# Patient Record
Sex: Female | Born: 1970 | Race: Black or African American | Hispanic: No | Marital: Married | Smoking: Never smoker
Health system: Southern US, Community
[De-identification: ages and names within clinical notes are randomized; demographics above are authoritative.]

## PROBLEM LIST (undated history)

## (undated) DIAGNOSIS — F329 Major depressive disorder, single episode, unspecified: Secondary | ICD-10-CM

## (undated) DIAGNOSIS — K5792 Diverticulitis of intestine, part unspecified, without perforation or abscess without bleeding: Secondary | ICD-10-CM

## (undated) DIAGNOSIS — R51 Headache: Secondary | ICD-10-CM

## (undated) DIAGNOSIS — K589 Irritable bowel syndrome without diarrhea: Secondary | ICD-10-CM

## (undated) DIAGNOSIS — E079 Disorder of thyroid, unspecified: Secondary | ICD-10-CM

## (undated) DIAGNOSIS — T7840XA Allergy, unspecified, initial encounter: Secondary | ICD-10-CM

## (undated) DIAGNOSIS — E119 Type 2 diabetes mellitus without complications: Secondary | ICD-10-CM

## (undated) DIAGNOSIS — K219 Gastro-esophageal reflux disease without esophagitis: Secondary | ICD-10-CM

## (undated) DIAGNOSIS — F32A Depression, unspecified: Secondary | ICD-10-CM

## (undated) DIAGNOSIS — R519 Headache, unspecified: Secondary | ICD-10-CM

## (undated) HISTORY — DX: Depression, unspecified: F32.A

## (undated) HISTORY — DX: Major depressive disorder, single episode, unspecified: F32.9

## (undated) HISTORY — DX: Disorder of thyroid, unspecified: E07.9

## (undated) HISTORY — PX: LAPAROSCOPIC GASTRIC SLEEVE RESECTION: SHX5895

## (undated) HISTORY — DX: Headache: R51

## (undated) HISTORY — DX: Type 2 diabetes mellitus without complications: E11.9

## (undated) HISTORY — PX: SHOULDER ARTHROSCOPY W/ ROTATOR CUFF REPAIR: SHX2400

## (undated) HISTORY — DX: Allergy, unspecified, initial encounter: T78.40XA

## (undated) HISTORY — DX: Diverticulitis of intestine, part unspecified, without perforation or abscess without bleeding: K57.92

## (undated) HISTORY — DX: Headache, unspecified: R51.9

---

## 2002-02-03 HISTORY — PX: ABDOMINAL HYSTERECTOMY: SHX81

## 2003-10-04 ENCOUNTER — Ambulatory Visit (HOSPITAL_COMMUNITY): Admission: RE | Admit: 2003-10-04 | Discharge: 2003-10-04 | Payer: Self-pay | Admitting: Gynecology

## 2003-11-03 ENCOUNTER — Ambulatory Visit (HOSPITAL_COMMUNITY): Admission: RE | Admit: 2003-11-03 | Discharge: 2003-11-03 | Payer: Self-pay | Admitting: Gynecology

## 2003-11-13 ENCOUNTER — Inpatient Hospital Stay (HOSPITAL_COMMUNITY): Admission: AD | Admit: 2003-11-13 | Discharge: 2003-11-16 | Payer: Self-pay | Admitting: Gynecology

## 2004-02-04 HISTORY — PX: APPENDECTOMY: SHX54

## 2005-02-03 HISTORY — PX: BREAST REDUCTION SURGERY: SHX8

## 2005-11-05 ENCOUNTER — Ambulatory Visit: Payer: Self-pay

## 2005-11-12 ENCOUNTER — Ambulatory Visit: Payer: Self-pay

## 2005-11-24 ENCOUNTER — Ambulatory Visit: Payer: Self-pay | Admitting: Gynecology

## 2005-12-17 ENCOUNTER — Ambulatory Visit: Payer: Self-pay | Admitting: Gastroenterology

## 2006-12-01 ENCOUNTER — Ambulatory Visit: Payer: Self-pay

## 2007-03-22 ENCOUNTER — Ambulatory Visit: Payer: Self-pay | Admitting: Obstetrics and Gynecology

## 2007-03-22 ENCOUNTER — Other Ambulatory Visit: Payer: Self-pay

## 2007-08-25 IMAGING — US US PELV - US TRANSVAGINAL
1 series · 17 of 25 positions shown · non-contrast
Comparison: none

REASON FOR EXAM: LT pelvic cyst seen on CT [DATE]
COMMENTS:

[Series 1: us pelv - us transvaginal · 17 of 27 slices shown]
[im 1/27]
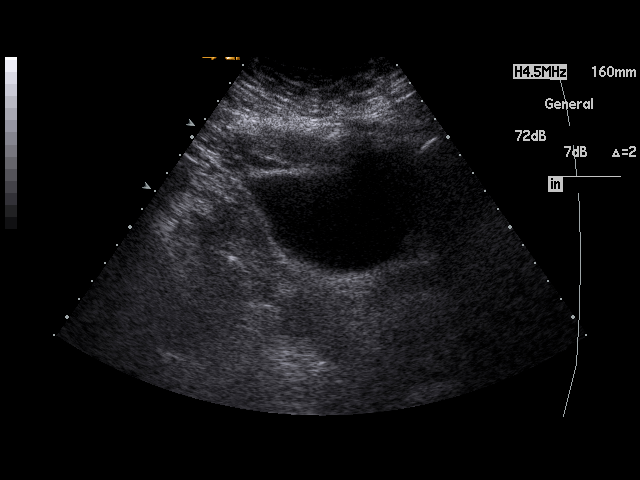
[im 3/27]
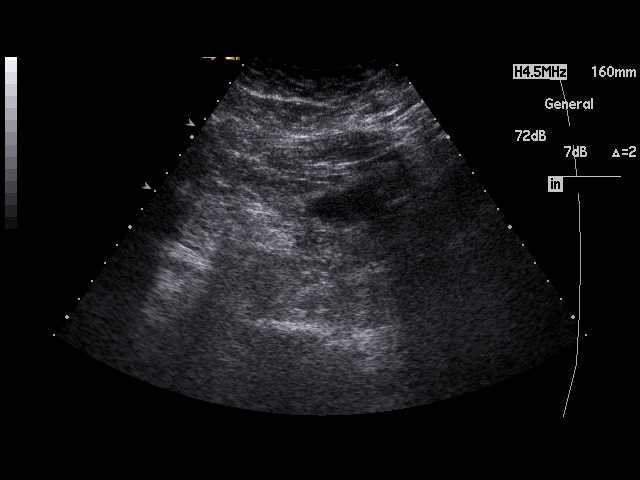
[im 4/27]
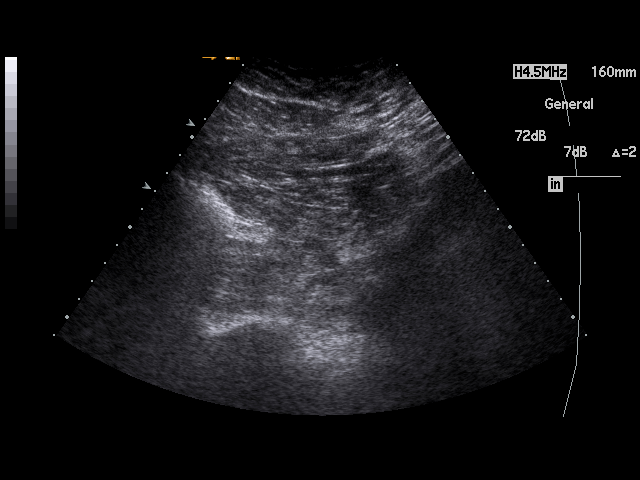
[im 6/27]
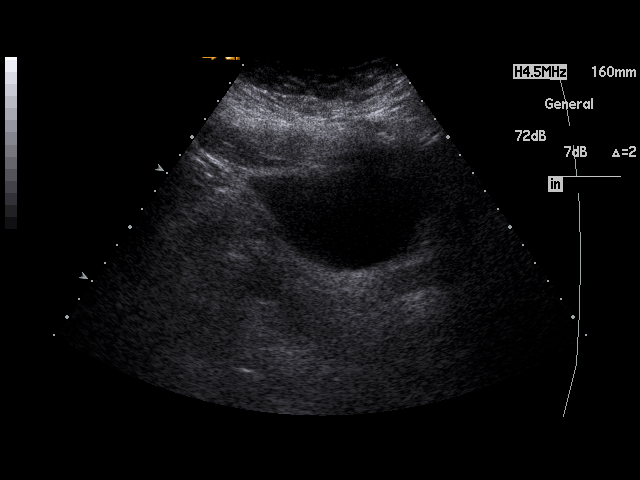
[im 7/27]
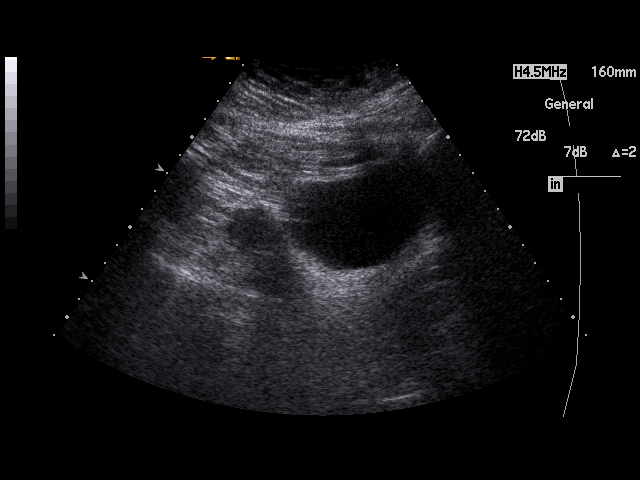
[im 9/27]
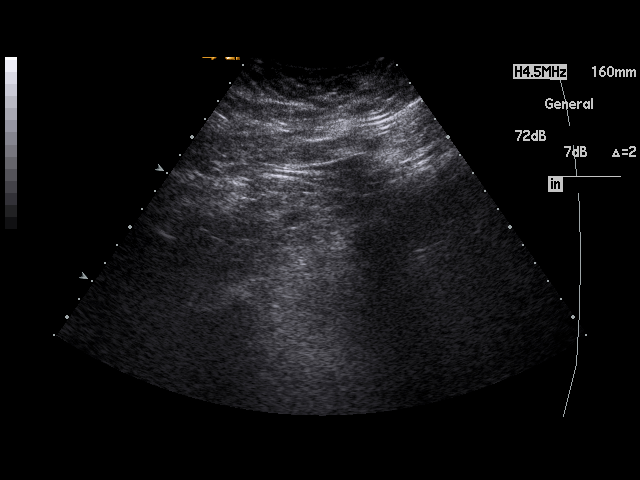
[im 10/27]
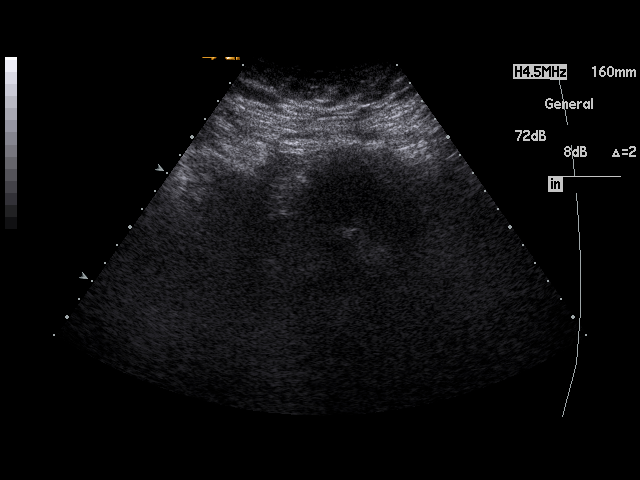
[im 12/27]
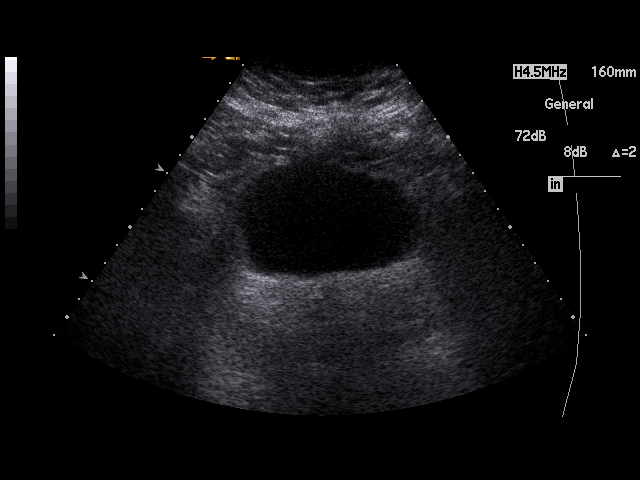
[im 14/27]
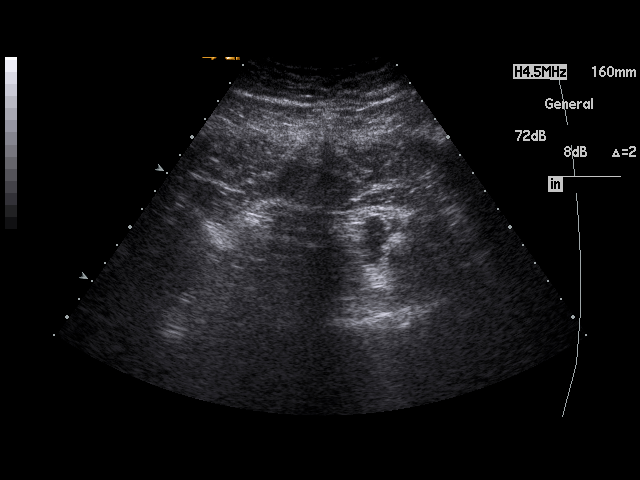
[im 15/27]
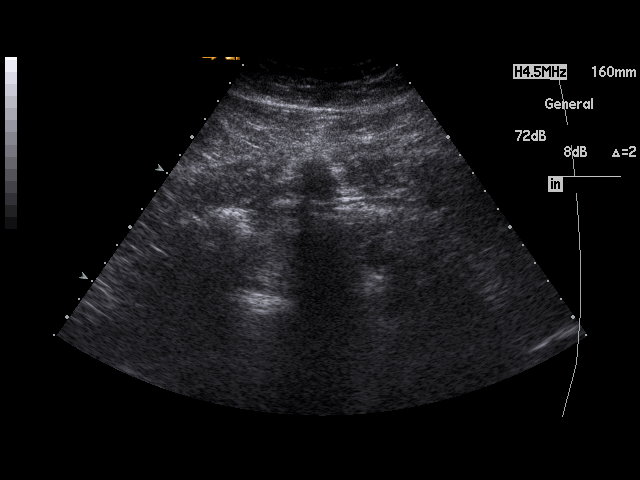
[im 17/27]
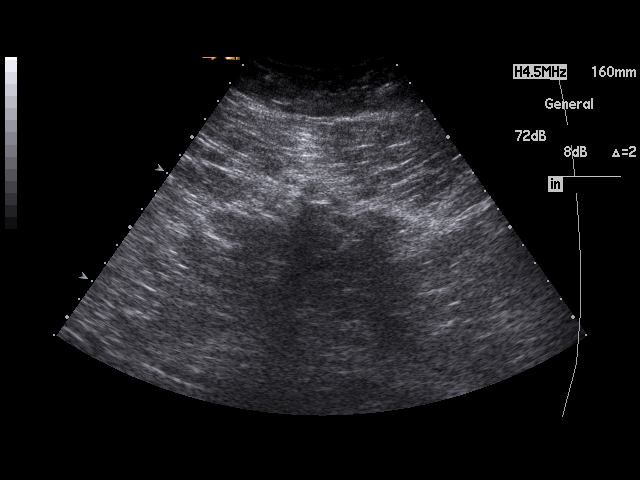
[im 18/27]
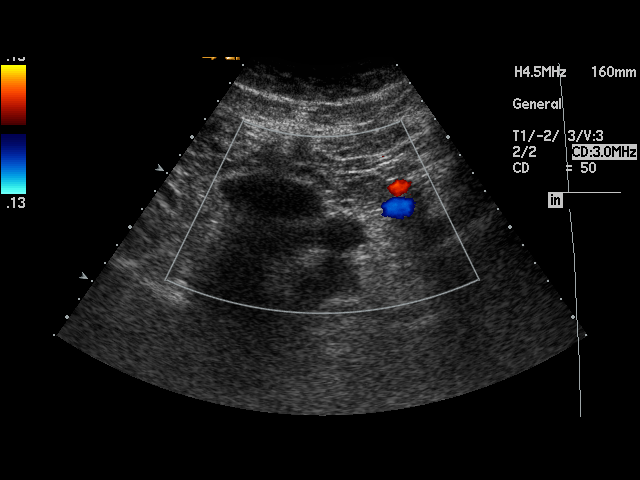
[im 20/27]
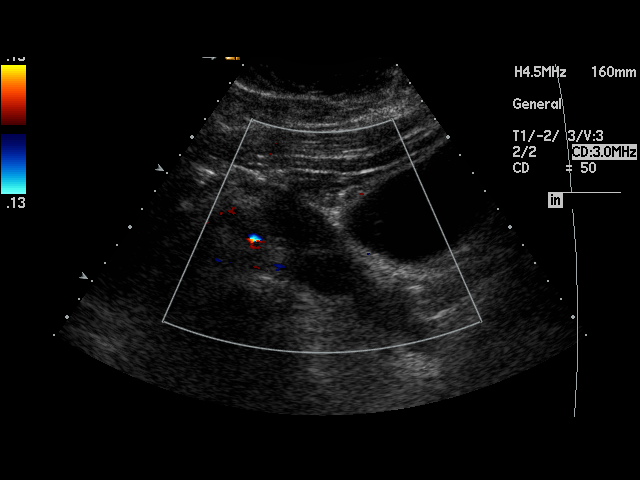
[im 21/27]
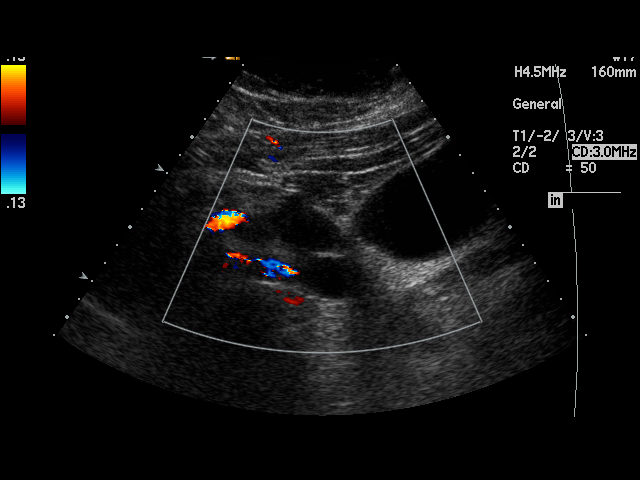
[im 23/27]
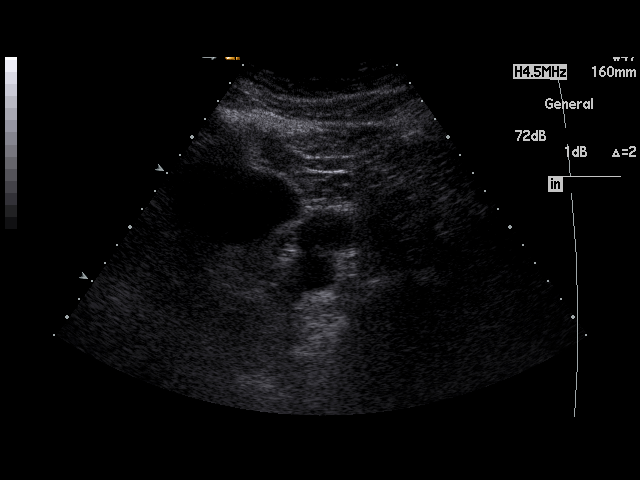
[im 24/27]
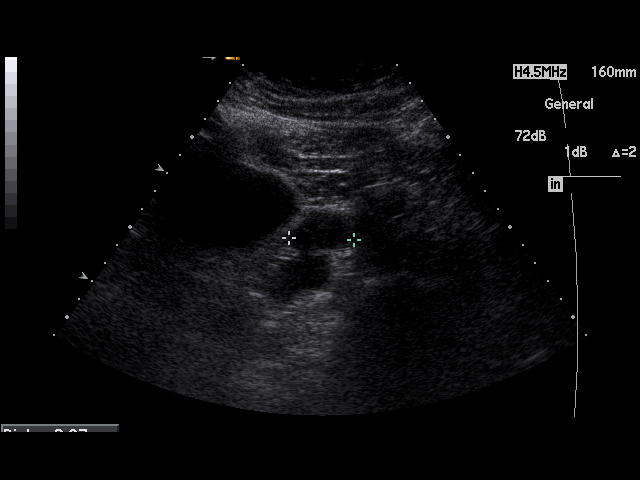
[im 27/27]
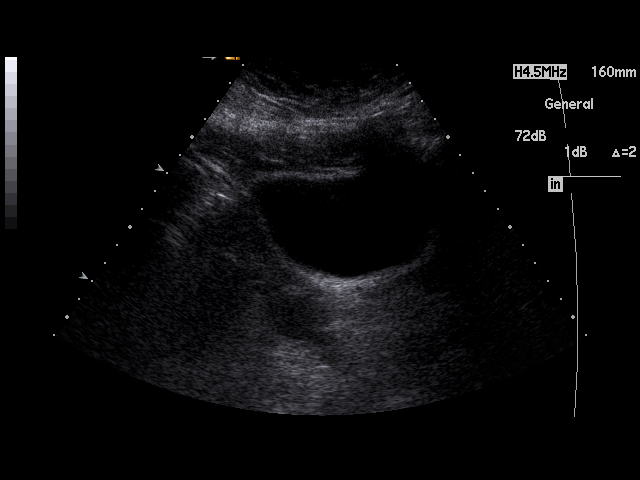

[17 of 25 positions shown; findings below may reference images not displayed]

PROCEDURE:     US  - US PELVIS MASS EXAM  - [DATE] [DATE] [DATE]  [DATE]

RESULT:     The patient underwent evaluation of the pelvis with ultrasound.
A cystic-appearing structure had been demonstrated on a CT scan in the LEFT
aspect of the pelvis.

The patient reportedly has undergone prior total abdominal hysterectomy.  In
the LEFT adnexal region there is a septated structure measuring 4.3 x 2.8 x
approximately 3 cm.  The etiology for this is not clear.  Review of the CT
images reveals the area of low density lies posterior to the upper aspect of
the urinary bladder and extends into the LEFT aspect of the pelvis.
IMPRESSION: The ultrasound today does reveal a complex cystic-appearing structure within
the pelvis that may reflect abscess, hematoma, or a cystic neoplasm.  It
does not appear to clearly communicate with the urinary bladder on the prior
CT scan.  Correlation clinically will be needed.

## 2008-01-10 ENCOUNTER — Ambulatory Visit: Payer: Self-pay | Admitting: Family Medicine

## 2012-02-04 HISTORY — PX: CHOLECYSTECTOMY: SHX55

## 2012-08-04 ENCOUNTER — Ambulatory Visit: Payer: Self-pay | Admitting: Specialist

## 2012-08-04 DIAGNOSIS — E119 Type 2 diabetes mellitus without complications: Secondary | ICD-10-CM

## 2012-08-04 LAB — CBC WITH DIFFERENTIAL/PLATELET
Basophil %: 0.9 %
Eosinophil #: 0.1 10*3/uL (ref 0.0–0.7)
Eosinophil %: 1.4 %
HGB: 12.9 g/dL (ref 12.0–16.0)
Lymphocyte #: 2.1 10*3/uL (ref 1.0–3.6)
MCHC: 33.2 g/dL (ref 32.0–36.0)
Monocyte #: 0.4 x10 3/mm (ref 0.2–0.9)
Neutrophil %: 48.2 %
Platelet: 247 10*3/uL (ref 150–440)
RBC: 4.6 10*6/uL (ref 3.80–5.20)

## 2012-08-04 LAB — COMPREHENSIVE METABOLIC PANEL
Alkaline Phosphatase: 97 U/L (ref 50–136)
Calcium, Total: 9.7 mg/dL (ref 8.5–10.1)
Chloride: 105 mmol/L (ref 98–107)
Creatinine: 0.59 mg/dL — ABNORMAL LOW (ref 0.60–1.30)
EGFR (African American): >60
EGFR (Non-African Amer.): >60
Osmolality: 277 (ref 275–301)
SGOT(AST): 17 U/L (ref 15–37)
SGPT (ALT): 19 U/L (ref 12–78)
Total Protein: 8.2 g/dL (ref 6.4–8.2)

## 2012-08-04 LAB — AMYLASE: Amylase: 51 U/L (ref 25–115)

## 2012-08-04 LAB — APTT: Activated PTT: 31.6 secs (ref 23.6–35.9)

## 2012-08-04 LAB — IRON AND TIBC: Iron Bind.Cap.(Total): 338 ug/dL (ref 250–450)

## 2012-08-04 LAB — FERRITIN: Ferritin (ARMC): 63 ng/mL (ref 8–388)

## 2012-08-04 LAB — PROTIME-INR
INR: 0.9
Prothrombin Time: 12.7 secs (ref 11.5–14.7)

## 2012-08-26 ENCOUNTER — Ambulatory Visit: Payer: Self-pay | Admitting: Specialist

## 2012-09-03 ENCOUNTER — Ambulatory Visit: Payer: Self-pay | Admitting: Specialist

## 2012-10-05 ENCOUNTER — Ambulatory Visit: Payer: Self-pay | Admitting: Specialist

## 2012-10-12 ENCOUNTER — Inpatient Hospital Stay: Payer: Self-pay | Admitting: Specialist

## 2012-10-13 LAB — CBC WITH DIFFERENTIAL/PLATELET
Basophil #: 0 10*3/uL (ref 0.0–0.1)
Basophil %: 0.4 %
MCH: 28.4 pg (ref 26.0–34.0)
MCV: 83 fL (ref 80–100)
Monocyte #: 1.5 x10 3/mm — ABNORMAL HIGH (ref 0.2–0.9)
Neutrophil #: 7.2 10*3/uL — ABNORMAL HIGH (ref 1.4–6.5)
Neutrophil %: 69.7 %
Platelet: 218 10*3/uL (ref 150–440)
RDW: 13.1 % (ref 11.5–14.5)

## 2012-10-13 LAB — BASIC METABOLIC PANEL
Anion Gap: 7 (ref 7–16)
Calcium, Total: 7.3 mg/dL — ABNORMAL LOW (ref 8.5–10.1)
Chloride: 108 mmol/L — ABNORMAL HIGH (ref 98–107)
Co2: 23 mmol/L (ref 21–32)
EGFR (African American): >60

## 2012-10-13 LAB — PHOSPHORUS: Phosphorus: 1.9 mg/dL — ABNORMAL LOW (ref 2.5–4.9)

## 2012-10-13 LAB — ALBUMIN: Albumin: 3.7 g/dL (ref 3.4–5.0)

## 2012-10-18 LAB — PATHOLOGY REPORT

## 2012-11-09 ENCOUNTER — Ambulatory Visit: Payer: Self-pay | Admitting: Specialist

## 2012-12-04 ENCOUNTER — Ambulatory Visit: Payer: Self-pay | Admitting: Specialist

## 2013-05-03 ENCOUNTER — Encounter: Payer: Self-pay | Admitting: Internal Medicine

## 2013-05-03 ENCOUNTER — Ambulatory Visit (INDEPENDENT_AMBULATORY_CARE_PROVIDER_SITE_OTHER): Payer: BC Managed Care – PPO | Admitting: Internal Medicine

## 2013-05-03 VITALS — BP 102/64 | HR 72 | Temp 98.2°F | Ht 61.5 in | Wt 138.0 lb

## 2013-05-03 DIAGNOSIS — R5383 Other fatigue: Secondary | ICD-10-CM

## 2013-05-03 DIAGNOSIS — E039 Hypothyroidism, unspecified: Secondary | ICD-10-CM | POA: Insufficient documentation

## 2013-05-03 DIAGNOSIS — R5381 Other malaise: Secondary | ICD-10-CM

## 2013-05-03 DIAGNOSIS — E119 Type 2 diabetes mellitus without complications: Secondary | ICD-10-CM | POA: Insufficient documentation

## 2013-05-03 DIAGNOSIS — R109 Unspecified abdominal pain: Secondary | ICD-10-CM

## 2013-05-03 LAB — CBC WITH DIFFERENTIAL/PLATELET
BASOS PCT: 0.7 % (ref 0.0–3.0)
Basophils Absolute: 0 10*3/uL (ref 0.0–0.1)
Eosinophils Absolute: 0.1 10*3/uL (ref 0.0–0.7)
Eosinophils Relative: 1.6 % (ref 0.0–5.0)
HCT: 37.6 % (ref 36.0–46.0)
Hemoglobin: 12.6 g/dL (ref 12.0–15.0)
LYMPHS PCT: 46.5 % — AB (ref 12.0–46.0)
Lymphs Abs: 1.9 10*3/uL (ref 0.7–4.0)
MCHC: 33.6 g/dL (ref 30.0–36.0)
MCV: 87.4 fl (ref 78.0–100.0)
MONO ABS: 0.4 10*3/uL (ref 0.1–1.0)
Monocytes Relative: 10.7 % (ref 3.0–12.0)
NEUTROS PCT: 40.5 % — AB (ref 43.0–77.0)
Neutro Abs: 1.7 10*3/uL (ref 1.4–7.7)
Platelets: 202 10*3/uL (ref 150.0–400.0)
RBC: 4.3 Mil/uL (ref 3.87–5.11)
RDW: 12.8 % (ref 11.5–14.6)
WBC: 4.2 10*3/uL — AB (ref 4.5–10.5)

## 2013-05-03 LAB — HEMOGLOBIN A1C: Hgb A1c MFr Bld: 5.4 % (ref 4.6–6.5)

## 2013-05-03 LAB — COMPREHENSIVE METABOLIC PANEL WITH GFR
ALT: 19 U/L (ref 0–35)
AST: 21 U/L (ref 0–37)
Albumin: 4.5 g/dL (ref 3.5–5.2)
Alkaline Phosphatase: 62 U/L (ref 39–117)
BUN: 16 mg/dL (ref 6–23)
CO2: 28 meq/L (ref 19–32)
Calcium: 9.8 mg/dL (ref 8.4–10.5)
Chloride: 105 meq/L (ref 96–112)
Creatinine, Ser: 0.6 mg/dL (ref 0.4–1.2)
GFR: 141.31 mL/min
Glucose, Bld: 78 mg/dL (ref 70–99)
Potassium: 4.5 meq/L (ref 3.5–5.1)
Sodium: 140 meq/L (ref 135–145)
Total Bilirubin: 0.4 mg/dL (ref 0.3–1.2)
Total Protein: 7.4 g/dL (ref 6.0–8.3)

## 2013-05-03 LAB — VITAMIN B12: Vitamin B-12: 740 pg/mL (ref 211–911)

## 2013-05-03 LAB — TSH: TSH: 2.68 u[IU]/mL (ref 0.35–5.50)

## 2013-05-03 NOTE — Progress Notes (Signed)
HPI  Pt presents to the clinic today to establish care. She does have a PCP, Dr. Patrecia Pace at [facility]. She does not want to transfer from his care.  She does have some concerns today about her most recent lab work. Her lab results have showed a low WBC, an elevated AST, hypercalcemia and high b12. She reports that she feels pain when she eats. She reports that she has difficulty swallowing, even with liquids. She has had a gastric sleeve and certain foods make her have violent diarrhea. She has had an upper endoscopy recently which was normal. She did have a colonoscopy in 2013 which showed diverticulosis otherwise normal. She is just very concerned about what is going on with her lab work.  Flu: yearly Tetanus: less than 10 years ago LMP: Hysterectomy Pap Smear: Hysterectomy Mammogram: 02/2011 Eye Doctor: as needed Dentist: as needed  Past Medical History  Diagnosis Date  . Allergy   . Depression   . Diabetes mellitus without complication   . Diverticulitis   . Thyroid disease   . Frequent headaches     Current Outpatient Prescriptions  Medication Sig Dispense Refill  . BIOTIN PO Take 1 capsule by mouth daily.      . Cholecalciferol 5000 UNITS capsule Take 5,000 Units by mouth daily.      . cholestyramine (QUESTRAN) 4 G packet Take 4 g by mouth 3 (three) times daily with meals.      . Multiple Vitamin (MULTIVITAMIN) tablet Take 1 tablet by mouth daily.      . vitamin B-12 (CYANOCOBALAMIN) 1000 MCG tablet Take 1,000 mcg by mouth daily.       No current facility-administered medications for this visit.    No Known Allergies  Family History  Problem Relation Age of Onset  . Stroke Mother   . Diabetes Mother   . Arthritis Father   . Hyperlipidemia Father   . Leukemia Paternal Aunt   . Stroke Maternal Grandmother   . Stroke Paternal Grandmother   . Cancer Paternal Grandfather     lung  . Stroke Paternal Grandfather     History   Social History  . Marital Status:  Married    Spouse Name: N/A    Number of Children: N/A  . Years of Education: N/A   Occupational History  . Not on file.   Social History Main Topics  . Smoking status: Never Smoker   . Smokeless tobacco: Never Used  . Alcohol Use: No  . Drug Use: No  . Sexual Activity: Not on file   Other Topics Concern  . Not on file   Social History Narrative  . No narrative on file    ROS:  Constitutional: Pt reports fatigue. Denies fever, malaise,  headache or abrupt weight changes.  Respiratory: Denies difficulty breathing, shortness of breath, cough or sputum production.   Cardiovascular: Denies chest pain, chest tightness, palpitations or swelling in the hands or feet.  Gastrointestinal: Pt reports abdominal cramping. Denies  . Musculoskeletal: Denies decrease in range of motion, difficulty with gait, muscle pain or joint pain and swelling.  Skin: Denies redness, rashes, lesions or ulcercations.  Neurological: Denies dizziness, difficulty with memory, difficulty with speech or problems with balance and coordination.   No other specific complaints in a complete review of systems (except as listed in HPI above).  PE:  BP 102/64  Pulse 72  Temp(Src) 98.2 F (36.8 C) (Oral)  Ht 5' 1.5" (1.562 m)  Wt 138 lb (62.596  kg)  BMI 25.66 kg/m2  SpO2 98% Wt Readings from Last 3 Encounters:  05/03/13 138 lb (62.596 kg)    General: Appears her stated age, well developed, well nourished in NAD. Cardiovascular: Normal rate and rhythm. S1,S2 noted.  No murmur, rubs or gallops noted. No JVD or BLE edema. No carotid bruits noted. Pulmonary/Chest: Normal effort and positive vesicular breath sounds. No respiratory distress. No wheezes, rales or ronchi noted.  Abdomen: Soft and nontender. Normal bowel sounds, no bruits noted. No distention or masses noted. Liver, spleen and kidneys non palpable.   Assessment and Plan:  Fatigue, abdominal cramping:  Will recheck all of her labs today Her  exam jscript:void(0)is normal today  DM2:   She reports this is weight and diet controlled She is not medicated  Hypothyroid:  Currently not on synthroid She does have multiple nodules Her neck Thyroid scan is in June  Will follow up with you after your test results

## 2013-05-03 NOTE — Progress Notes (Signed)
Pre visit review using our clinic review tool, if applicable. No additional management support is needed unless otherwise documented below in the visit note. 

## 2013-05-10 ENCOUNTER — Other Ambulatory Visit: Payer: Self-pay | Admitting: Internal Medicine

## 2013-05-10 DIAGNOSIS — D709 Neutropenia, unspecified: Secondary | ICD-10-CM

## 2013-05-16 ENCOUNTER — Telehealth: Payer: Self-pay

## 2013-05-16 NOTE — Telephone Encounter (Signed)
Relevant patient education assigned to patient using Emmi. ° °

## 2013-05-19 ENCOUNTER — Ambulatory Visit: Payer: Self-pay | Admitting: Oncology

## 2013-05-19 LAB — CBC CANCER CENTER
Basophil #: 0 x10 3/mm (ref 0.0–0.1)
Basophil %: 1 %
EOS ABS: 0 x10 3/mm (ref 0.0–0.7)
Eosinophil %: 1.1 %
HCT: 40.3 % (ref 35.0–47.0)
HGB: 13.1 g/dL (ref 12.0–16.0)
Lymphocyte #: 1.9 x10 3/mm (ref 1.0–3.6)
Lymphocyte %: 45.8 %
MCH: 28.7 pg (ref 26.0–34.0)
MCHC: 32.5 g/dL (ref 32.0–36.0)
MCV: 88 fL (ref 80–100)
MONO ABS: 0.4 x10 3/mm (ref 0.2–0.9)
Monocyte %: 9.9 %
Neutrophil #: 1.8 x10 3/mm (ref 1.4–6.5)
Neutrophil %: 42.2 %
Platelet: 209 x10 3/mm (ref 150–440)
RBC: 4.58 10*6/uL (ref 3.80–5.20)
RDW: 12.7 % (ref 11.5–14.5)
WBC: 4.2 x10 3/mm (ref 3.6–11.0)

## 2013-05-19 LAB — LACTATE DEHYDROGENASE: LDH: 176 U/L (ref 81–246)

## 2013-06-03 ENCOUNTER — Ambulatory Visit: Payer: Self-pay | Admitting: Oncology

## 2013-10-06 ENCOUNTER — Ambulatory Visit: Payer: Self-pay | Admitting: Oncology

## 2013-10-06 LAB — BASIC METABOLIC PANEL
Anion Gap: 8 (ref 7–16)
BUN: 14 mg/dL (ref 7–18)
CREATININE: 0.67 mg/dL (ref 0.60–1.30)
Calcium, Total: 8.8 mg/dL (ref 8.5–10.1)
Chloride: 104 mmol/L (ref 98–107)
Co2: 30 mmol/L (ref 21–32)
EGFR (African American): >60
EGFR (Non-African Amer.): >60
Glucose: 77 mg/dL (ref 65–99)
Osmolality: 282 (ref 275–301)
Potassium: 3.9 mmol/L (ref 3.5–5.1)
SODIUM: 142 mmol/L (ref 136–145)

## 2013-10-06 LAB — CBC CANCER CENTER
BASOS ABS: 0 x10 3/mm (ref 0.0–0.1)
BASOS PCT: 0.9 %
EOS PCT: 1.5 %
Eosinophil #: 0.1 x10 3/mm (ref 0.0–0.7)
HCT: 39.2 % (ref 35.0–47.0)
HGB: 12.9 g/dL (ref 12.0–16.0)
Lymphocyte #: 1.8 x10 3/mm (ref 1.0–3.6)
Lymphocyte %: 44.2 %
MCH: 28.7 pg (ref 26.0–34.0)
MCHC: 32.8 g/dL (ref 32.0–36.0)
MCV: 87 fL (ref 80–100)
MONOS PCT: 11.9 %
Monocyte #: 0.5 x10 3/mm (ref 0.2–0.9)
NEUTROS PCT: 41.5 %
Neutrophil #: 1.7 x10 3/mm (ref 1.4–6.5)
PLATELETS: 191 x10 3/mm (ref 150–440)
RBC: 4.49 10*6/uL (ref 3.80–5.20)
RDW: 12.9 % (ref 11.5–14.5)
WBC: 4.1 x10 3/mm (ref 3.6–11.0)

## 2013-11-03 ENCOUNTER — Ambulatory Visit: Payer: Self-pay | Admitting: Oncology

## 2014-01-17 ENCOUNTER — Ambulatory Visit: Payer: Self-pay | Admitting: Nurse Practitioner

## 2014-05-26 NOTE — Op Note (Signed)
PATIENT NAME:  Susan Stokes, Susan Stokes MR#:  [id number] DATE OF BIRTH:  [birth year]  DATE OF PROCEDURE:  10/12/2012  PREOPERATIVE DIAGNOSES:  Morbid obesity and gallstones.   POSTOPERATIVE DIAGNOSES:  Morbid obesity and gallstones.   PROCEDURE: Laparoscopic sleeve gastrectomy with cholecystectomy.   SURGEON: Primus BravoJon Bruce, M.D.   ASSISTANT:  Sarah (Dictation Anomaly) stout.   COMPLICATIONS:  None.   ANESTHESIA: General endotracheal.   CLINICAL HISTORY: See H and P.    DETAILS OF PROCEDURE: PREOPERATIVE DIAGNOSIS: Morbid obesity.  POSTOPERATIVE DIAGNOSIS: Morbid obesity.  PROCEDURE:  Sleeve gastrectomy.  SURGEON: Primus BravoJon Bruce, MD  ASSISTANT:  Tobe SosWill Hawkins, PA  ANESTHESIA:  General endotracheal.  INDICATION:  See History and Physical.   COMPLICATIONS: None.  ESTIMATED BLOOD LOSS: None.  DETAILS OF PROCEDURE:  The patient was taken to the operating room and placed on the operating room table, in the supine position, with appropriate monitors and supplemental oxygen being delivered.  Broad spectrum IV antibiotics were administered. The patient was placed under general anesthesia without incident.  The abdomen was prepped and draped in the usual sterile fashion.  Access was obtained using 5 mm Optical trocar. Pneumoperitoneum was established without difficulty. Multiple other ports were placed in preparation for sleeve gastrectomy. A liver retractor was placed without incident. The entire stomach was mobilized from 5 cm from the pylorus all the way up to the fundus, and the fundus was mobilized off the left crura as well completely freeing up the posterior portion of the stomach. Posterior attachments were taken down so the crura could be visualized from both sides.  At that point, everything was removed from the stomach and a 55[address] [place] Bougie was placed down into the antrum. An Echelon green load stapler was used to bisect the antrum on first fire starting approximately 5 to 6 cm from the pylorus. I  then continued up along the Bougie using a blue load stapler with excellent affect with care not to get too close to the Bougie itself, with minimal traction. This continued all the way up to the left crura. The excess stomach was placed on the side and the Bougie was removed and endoscopy showed no evidence of obstruction at that time.  The excess stomach was removed through the abdominal cavity, and the wounds were closed using 4-0 Vicryl and Dermabond.   ADDENDUM:  The gallbladder was raised anteriorly and cephalad plane of vision. The cystic duct and cystic artery were dissected free to the view of safety, and cystic artery was divided using the Harmonic scalpel with excellent hemostasis. The cystic duct was clipped twice proximally and once distally and divided sharply. The gallbladder was moved off the liver bed and sent with the retrieval bag for pathologic evaluation. Right upper quadrant was lavaged, suctioned and then the trocars were all removed.     ____________________________ Primus BravoJon Bruce, MD jb:dmm D: 10/12/2012 11:13:00 ET T: 10/12/2012 11:23:29 ET JOB#: [id number]  cc: Primus BravoJon Bruce, MD, <Dictator> Geoffry ParadiseJON M BRUCE MD ELECTRONICALLY SIGNED 10/18/2012 15:52

## 2015-12-22 ENCOUNTER — Emergency Department
Admission: EM | Admit: 2015-12-22 | Discharge: 2015-12-22 | Disposition: A | Discharge status: Home / Self Care | Payer: BLUE CROSS/BLUE SHIELD | Attending: Emergency Medicine | Admitting: Emergency Medicine

## 2015-12-22 ENCOUNTER — Encounter: Payer: Self-pay | Admitting: Emergency Medicine

## 2015-12-22 DIAGNOSIS — E119 Type 2 diabetes mellitus without complications: Secondary | ICD-10-CM | POA: Diagnosis not present

## 2015-12-22 DIAGNOSIS — Y999 Unspecified external cause status: Secondary | ICD-10-CM | POA: Diagnosis not present

## 2015-12-22 DIAGNOSIS — Y939 Activity, unspecified: Secondary | ICD-10-CM | POA: Diagnosis not present

## 2015-12-22 DIAGNOSIS — Y929 Unspecified place or not applicable: Secondary | ICD-10-CM | POA: Diagnosis not present

## 2015-12-22 DIAGNOSIS — S0502XA Injury of conjunctiva and corneal abrasion without foreign body, left eye, initial encounter: Secondary | ICD-10-CM | POA: Diagnosis not present

## 2015-12-22 DIAGNOSIS — X58XXXA Exposure to other specified factors, initial encounter: Secondary | ICD-10-CM | POA: Diagnosis not present

## 2015-12-22 DIAGNOSIS — E039 Hypothyroidism, unspecified: Secondary | ICD-10-CM | POA: Diagnosis not present

## 2015-12-22 DIAGNOSIS — S0592XA Unspecified injury of left eye and orbit, initial encounter: Secondary | ICD-10-CM | POA: Diagnosis present

## 2015-12-22 MED ORDER — TETRACAINE HCL 0.5 % OP SOLN
2.0000 [drp] | Freq: Once | OPHTHALMIC | Status: AC
  Administered 2015-12-22: 2 [drp] via OPHTHALMIC
  Filled 2015-12-22: qty 2

## 2015-12-22 MED ORDER — FLUORESCEIN SODIUM 1 MG OP STRP
1.0000 | ORAL_STRIP | Freq: Once | OPHTHALMIC | Status: AC
  Administered 2015-12-22: 1 via OPHTHALMIC
  Filled 2015-12-22: qty 1

## 2015-12-22 MED ORDER — KETOROLAC TROMETHAMINE 0.5 % OP SOLN: 1.0000 [drp] | Freq: Four times a day (QID) | OPHTHALMIC | 0 refills | Status: DC

## 2015-12-22 NOTE — ED Triage Notes (Signed)
During day started to have L eye pain. States felt like something got in her eye while she was at vet this am.

## 2015-12-22 NOTE — ED Notes (Signed)

## 2015-12-22 NOTE — ED Provider Notes (Signed)
[facility] Emergency Department Provider Note  ____________________________________________  Time seen: Approximately 7:01 PM  I have reviewed the triage vital signs and the nursing notes.   HISTORY  Chief Complaint Eye Pain    HPI Susan Stokes is a 45 y.o. female who presents to emergency department complaining of left eye irritation. Patient states that she has a foreign body sensation to the left eye. She states that she was at the that office earlier this morning when symptoms began. She denies any direct trauma to the eye. She denies any visual acuity changes. No drainage. No other symptoms or complaints. She has tried multiple eyewashes with no relief of symptoms. Patient wears glasses but does not wear contacts.   Past Medical History:  Diagnosis Date  . Allergy   . Depression   . Diabetes mellitus without complication (HCC)   . Diverticulitis   . Frequent headaches   . Thyroid disease     Patient Active Problem List   Diagnosis Date Noted  . DM type 2 (diabetes mellitus, type 2) (HCC) 05/03/2013  . Hypothyroid 05/03/2013    Past Surgical History:  Procedure Laterality Date  . ABDOMINAL HYSTERECTOMY  2004  . APPENDECTOMY  2006  . BREAST REDUCTION SURGERY  2007  . CHOLECYSTECTOMY  2014  . LAPAROSCOPIC GASTRIC SLEEVE RESECTION      Prior to Admission medications   Medication Sig Start Date End Date Taking? Authorizing Provider  BIOTIN PO Take 1 capsule by mouth daily.    Historical Provider, MD  Cholecalciferol 5000 UNITS capsule Take 5,000 Units by mouth daily.    Historical Provider, MD  cholestyramine Lanetta Inch(QUESTRAN) 4 G packet Take 4 g by mouth 3 (three) times daily with meals.    Historical Provider, MD  ketorolac (ACULAR) 0.5 % ophthalmic solution Place 1 drop into the left eye 4 (four) times daily. 12/22/15   Delorise RoyalsJonathan D Areil Ottey, PA-C  Multiple Vitamin (MULTIVITAMIN) tablet Take 1 tablet by mouth daily.    Historical  Provider, MD  vitamin B-12 (CYANOCOBALAMIN) 1000 MCG tablet Take 1,000 mcg by mouth daily.    Historical Provider, MD    Allergies Patient has no known allergies.  Family History  Problem Relation Age of Onset  . Stroke Mother   . Diabetes Mother   . Arthritis Father   . Hyperlipidemia Father   . Leukemia Paternal Aunt   . Stroke Maternal Grandmother   . Stroke Paternal Grandmother   . Cancer Paternal Grandfather     lung  . Stroke Paternal Grandfather     Social History Social History  Substance Use Topics  . Smoking status: Never Smoker  . Smokeless tobacco: Never Used  . Alcohol use No     Review of Systems  Constitutional: No fever/chills Eyes: No visual changes. No discharge. Positive for foreign body sensation to the left eye ENT: No upper respiratory complaints. Cardiovascular: no chest pain. Respiratory: no cough. No SOB. Musculoskeletal: Negative for musculoskeletal pain. Skin: Negative for rash, abrasions, lacerations, ecchymosis. Neurological: Negative for headaches, focal weakness or numbness. 10-point ROS otherwise negative.  ____________________________________________   PHYSICAL EXAM:  VITAL SIGNS: ED Triage Vitals  Enc Vitals Group     BP 12/22/15 1820 (!) 133/96     Pulse Rate 12/22/15 1820 78     Resp 12/22/15 1820 20     Temp 12/22/15 1820 98 F (36.7 C)     Temp Source 12/22/15 1820 Oral     SpO2 12/22/15 1820 98 %  Weight 12/22/15 1824 140 lb (63.5 kg)     Height 12/22/15 1824 5\' 1"  (1.549 m)     Head Circumference --      Peak Flow --      Pain Score 12/22/15 1825 10     Pain Loc --      Pain Edu? --      Excl. in GC? --      Constitutional: Alert and oriented. Well appearing and in no acute distress. Eyes: Conjunctivae are normal. PERRL. EOMI.Funduscopic exam is unremarkable bilaterally with no indication of foreign body, good red reflex, vascular turn optic disc visualized with no acute abnormality. Fluorescein staining  reveals area of uptake in the 3:00 position of the eye. No foreign body. This is superficial and this did not penetrate the globe Head: Atraumatic. Neck: No stridor.    Cardiovascular: Normal rate, regular rhythm. Normal S1 and S2.  Good peripheral circulation. Respiratory: Normal respiratory effort without tachypnea or retractions. Lungs CTAB. Good air entry to the bases with no decreased or absent breath sounds. Musculoskeletal: Full range of motion to all extremities. No gross deformities appreciated. Neurologic:  Normal speech and language. No gross focal neurologic deficits are appreciated.  Skin:  Skin is warm, dry and intact. No rash noted. Psychiatric: Mood and affect are normal. Speech and behavior are normal. Patient exhibits appropriate insight and judgement.   ____________________________________________   LABS (all labs ordered are listed, but only abnormal results are displayed)  Labs Reviewed - No data to display ____________________________________________  EKG   ____________________________________________  RADIOLOGY   No results found.  ____________________________________________    PROCEDURES  Procedure(s) performed:    Procedures    Medications  fluorescein ophthalmic strip 1 strip (1 strip Left Eye Given by Other 12/22/15 1919)  tetracaine (PONTOCAINE) 0.5 % ophthalmic solution 2 drop (2 drops Left Eye Given by Other 12/22/15 1919)     ____________________________________________   INITIAL IMPRESSION / ASSESSMENT AND PLAN / ED COURSE  Pertinent labs & imaging results that were available during my care of the patient were reviewed by me and considered in my medical decision making (see chart for details).  Review of the [place] CSRS was performed in accordance of the [facility] prior to dispensing any controlled drugs.  Clinical Course     Patient's diagnosis is consistent with Corneal abrasion to the left eye. Exam is reassuring. Fluorescein  staining reveals the above diagnosis. Patient is given symptom control medications of Acular. If symptoms persist patient will follow-up with ophthalmology..Patient is given ED precautions to return to the ED for any worsening or new symptoms.     ____________________________________________  FINAL CLINICAL IMPRESSION(S) / ED DIAGNOSES  Final diagnoses:  Abrasion of left cornea, initial encounter      NEW MEDICATIONS STARTED DURING THIS VISIT:  Discharge Medication List as of 12/22/2015  7:19 PM    START taking these medications   Details  ketorolac (ACULAR) 0.5 % ophthalmic solution Place 1 drop into the left eye 4 (four) times daily., Starting Sat 12/22/2015, Print            This chart was dictated using voice recognition software/Dragon. Despite best efforts to proofread, errors can occur which can change the meaning. Any change was purely unintentional.    Racheal PatchesJonathan D Demone Lyles, PA-C 12/22/15 1949    Sharman CheekPhillip Stafford, MD 01/01/16 [phone]

## 2016-03-18 DIAGNOSIS — K219 Gastro-esophageal reflux disease without esophagitis: Secondary | ICD-10-CM | POA: Insufficient documentation

## 2016-05-27 ENCOUNTER — Encounter: Payer: Self-pay | Admitting: Emergency Medicine

## 2016-05-27 ENCOUNTER — Emergency Department: Payer: BLUE CROSS/BLUE SHIELD

## 2016-05-27 ENCOUNTER — Inpatient Hospital Stay
Admission: EM | Admit: 2016-05-27 | Discharge: 2016-05-28 | DRG: 439 | Disposition: A | Discharge status: Home / Self Care | Payer: BLUE CROSS/BLUE SHIELD | Attending: Internal Medicine | Admitting: Internal Medicine

## 2016-05-27 DIAGNOSIS — R109 Unspecified abdominal pain: Secondary | ICD-10-CM | POA: Diagnosis not present

## 2016-05-27 DIAGNOSIS — K859 Acute pancreatitis without necrosis or infection, unspecified: Secondary | ICD-10-CM | POA: Diagnosis not present

## 2016-05-27 DIAGNOSIS — K909 Intestinal malabsorption, unspecified: Secondary | ICD-10-CM | POA: Diagnosis present

## 2016-05-27 DIAGNOSIS — R197 Diarrhea, unspecified: Secondary | ICD-10-CM | POA: Insufficient documentation

## 2016-05-27 DIAGNOSIS — Z9884 Bariatric surgery status: Secondary | ICD-10-CM

## 2016-05-27 DIAGNOSIS — R740 Nonspecific elevation of levels of transaminase and lactic acid dehydrogenase [LDH]: Secondary | ICD-10-CM | POA: Diagnosis present

## 2016-05-27 HISTORY — DX: Irritable bowel syndrome, unspecified: K58.9

## 2016-05-27 LAB — CBC
HEMATOCRIT: 38.5 % (ref 35.0–47.0)
HEMOGLOBIN: 12.9 g/dL (ref 12.0–16.0)
MCH: 29 pg (ref 26.0–34.0)
MCHC: 33.5 g/dL (ref 32.0–36.0)
MCV: 86.6 fL (ref 80.0–100.0)
Platelets: 211 10*3/uL (ref 150–440)
RBC: 4.44 MIL/uL (ref 3.80–5.20)
RDW: 13.4 % (ref 11.5–14.5)
WBC: 4.8 10*3/uL (ref 3.6–11.0)

## 2016-05-27 LAB — BASIC METABOLIC PANEL
ANION GAP: 7 (ref 5–15)
BUN: 23 mg/dL — ABNORMAL HIGH (ref 6–20)
CO2: 26 mmol/L (ref 22–32)
Calcium: 9.7 mg/dL (ref 8.9–10.3)
Chloride: 106 mmol/L (ref 101–111)
Creatinine, Ser: 0.57 mg/dL (ref 0.44–1.00)
GFR calc Af Amer: >60 mL/min (ref >60)
GLUCOSE: 99 mg/dL (ref 65–99)
POTASSIUM: 4.3 mmol/L (ref 3.5–5.1)
Sodium: 139 mmol/L (ref 135–145)

## 2016-05-27 LAB — TROPONIN I: Troponin I: <0.03 ng/mL (ref <0.03)

## 2016-05-27 MED ORDER — ONDANSETRON 4 MG PO TBDP
4.0000 mg | ORAL_TABLET | Freq: Once | ORAL | Status: AC
  Administered 2016-05-27: 4 mg via ORAL
  Filled 2016-05-27: qty 1

## 2016-05-27 MED ORDER — GI COCKTAIL ~~LOC~~
30.0000 mL | Freq: Once | ORAL | Status: AC
  Administered 2016-05-27: 30 mL via ORAL
  Filled 2016-05-27: qty 30

## 2016-05-27 MED ORDER — SUCRALFATE 1 G PO TABS
1.0000 g | ORAL_TABLET | Freq: Once | ORAL | Status: AC
  Administered 2016-05-27: 1 g via ORAL
  Filled 2016-05-27: qty 1

## 2016-05-27 NOTE — ED Triage Notes (Signed)
Patient states that she started taking a new medication tonight aroujnd 19:00 for IBS. Patient reports that around 21:00 she developed chest pain, shortness of breath and nausea.

## 2016-05-27 NOTE — ED Notes (Signed)
Patient transported to X-ray 

## 2016-05-27 NOTE — ED Notes (Signed)
Pt reports she suddenly felt SOB and epigastric pain with nausea/vomiting after Diphenoxylate-atrop.

## 2016-05-28 ENCOUNTER — Emergency Department: Payer: BLUE CROSS/BLUE SHIELD

## 2016-05-28 DIAGNOSIS — K909 Intestinal malabsorption, unspecified: Secondary | ICD-10-CM | POA: Diagnosis present

## 2016-05-28 DIAGNOSIS — K859 Acute pancreatitis without necrosis or infection, unspecified: Secondary | ICD-10-CM | POA: Diagnosis present

## 2016-05-28 DIAGNOSIS — R740 Nonspecific elevation of levels of transaminase and lactic acid dehydrogenase [LDH]: Secondary | ICD-10-CM | POA: Diagnosis present

## 2016-05-28 DIAGNOSIS — R109 Unspecified abdominal pain: Secondary | ICD-10-CM | POA: Diagnosis present

## 2016-05-28 DIAGNOSIS — Z9884 Bariatric surgery status: Secondary | ICD-10-CM | POA: Diagnosis not present

## 2016-05-28 LAB — HEPATIC FUNCTION PANEL
ALBUMIN: 4.1 g/dL (ref 3.5–5.0)
ALK PHOS: 63 U/L (ref 38–126)
ALT: 89 U/L — ABNORMAL HIGH (ref 14–54)
AST: 234 U/L — AB (ref 15–41)
BILIRUBIN TOTAL: 0.3 mg/dL (ref 0.3–1.2)
Total Protein: 7.1 g/dL (ref 6.5–8.1)

## 2016-05-28 LAB — TSH: TSH: 2.407 u[IU]/mL (ref 0.350–4.500)

## 2016-05-28 LAB — LIPASE, BLOOD: Lipase: 366 U/L — ABNORMAL HIGH (ref 11–51)

## 2016-05-28 MED ORDER — ONDANSETRON HCL 4 MG PO TABS: 4.0000 mg | ORAL_TABLET | Freq: Four times a day (QID) | ORAL | 0 refills | Status: DC | PRN

## 2016-05-28 MED ORDER — MORPHINE SULFATE (PF) 2 MG/ML IV SOLN
2.0000 mg | INTRAVENOUS | Status: DC | PRN
Start: 2016-05-28 — End: 2016-05-28

## 2016-05-28 MED ORDER — ENOXAPARIN SODIUM 40 MG/0.4ML ~~LOC~~ SOLN
40.0000 mg | SUBCUTANEOUS | Status: DC
  Administered 2016-05-28: 40 mg via SUBCUTANEOUS
  Filled 2016-05-28: qty 0.4

## 2016-05-28 MED ORDER — DOCUSATE SODIUM 100 MG PO CAPS
100.0000 mg | ORAL_CAPSULE | Freq: Two times a day (BID) | ORAL | Status: DC
  Administered 2016-05-28: 100 mg via ORAL
  Filled 2016-05-28: qty 1

## 2016-05-28 MED ORDER — ONDANSETRON HCL 4 MG/2ML IJ SOLN
4.0000 mg | Freq: Once | INTRAMUSCULAR | Status: AC
  Administered 2016-05-28: 4 mg via INTRAVENOUS
  Filled 2016-05-28: qty 2

## 2016-05-28 MED ORDER — ACETAMINOPHEN 650 MG RE SUPP: 650.0000 mg | Freq: Four times a day (QID) | RECTAL | Status: DC | PRN

## 2016-05-28 MED ORDER — MORPHINE SULFATE (PF) 4 MG/ML IV SOLN
4.0000 mg | Freq: Once | INTRAVENOUS | Status: AC
  Administered 2016-05-28: 4 mg via INTRAVENOUS
  Filled 2016-05-28: qty 1

## 2016-05-28 MED ORDER — ONDANSETRON HCL 4 MG PO TABS: 4.0000 mg | ORAL_TABLET | Freq: Four times a day (QID) | ORAL | Status: DC | PRN

## 2016-05-28 MED ORDER — SODIUM CHLORIDE 0.9 % IV SOLN
INTRAVENOUS | Status: DC
  Administered 2016-05-28: 04:00:00 via INTRAVENOUS

## 2016-05-28 MED ORDER — ONDANSETRON HCL 4 MG/2ML IJ SOLN: 4.0000 mg | Freq: Four times a day (QID) | INTRAMUSCULAR | Status: DC | PRN

## 2016-05-28 MED ORDER — ACETAMINOPHEN 325 MG PO TABS
650.0000 mg | ORAL_TABLET | Freq: Four times a day (QID) | ORAL | Status: DC | PRN
  Administered 2016-05-28: 650 mg via ORAL
  Filled 2016-05-28: qty 2

## 2016-05-28 NOTE — ED Provider Notes (Signed)
[facility] Emergency Department Provider Note   ____________________________________________   First MD Initiated Contact with Patient 05/27/16 2334     (approximate)  I have reviewed the triage vital signs and the nursing notes.   HISTORY  Chief Complaint Chest Pain and Nausea    HPI Susan Stokes is a 46 y.o. female who comes into the hospital today with some chest and abdominal pain. The patient started taking Lomotil today and started having some reactions. She's had some chest pain with some nausea that has been severe. She feels that this bubbles under her chest. It started around 9 PM and she took the medicine around 7:30. The patient reports that she was trying to calm down her bowels as she is been having diarrhea for months. The patient reports that she cannot tolerate anything that may have narcotics in it. Patient had some steamed shrimp for dinner. She is been burping a lot and it seems to make her pains worse. The patient rates her pain a 10 out of 10 in intensity. She is here today for evaluation and treatment of this pain in her symptoms.   Past Medical History:  Diagnosis Date  . Allergy   . Depression   . Diabetes mellitus without complication (HCC)   . Diverticulitis   . Frequent headaches   . IBS (irritable bowel syndrome)   . Thyroid disease     Patient Active Problem List   Diagnosis Date Noted  . Pancreatitis 05/28/2016  . DM type 2 (diabetes mellitus, type 2) (HCC) 05/03/2013  . Hypothyroid 05/03/2013    Past Surgical History:  Procedure Laterality Date  . ABDOMINAL HYSTERECTOMY  2004  . APPENDECTOMY  2006  . BREAST REDUCTION SURGERY  2007  . CHOLECYSTECTOMY  2014  . LAPAROSCOPIC GASTRIC SLEEVE RESECTION      Prior to Admission medications   Medication Sig Start Date End Date Taking? Authorizing Provider  diphenoxylate-atropine (LOMOTIL) 2.5-0.025 MG tablet Take 2 tablets by mouth 3 (three) times daily  before meals.   Yes Historical Provider, MD    Allergies Patient has no known allergies.  Family History  Problem Relation Age of Onset  . Stroke Mother   . Diabetes Mother   . Arthritis Father   . Hyperlipidemia Father   . Leukemia Paternal Aunt   . Stroke Maternal Grandmother   . Stroke Paternal Grandmother   . Cancer Paternal Grandfather     lung  . Stroke Paternal Grandfather     Social History Social History  Substance Use Topics  . Smoking status: Never Smoker  . Smokeless tobacco: Never Used  . Alcohol use No    Review of Systems  Constitutional: No fever/chills Eyes: No visual changes. ENT: No sore throat. Cardiovascular:  chest pain. Respiratory: Denies shortness of breath. Gastrointestinal:  abdominal pain, nausea, no vomiting.  No diarrhea.  No constipation. Genitourinary: Negative for dysuria. Musculoskeletal: Negative for back pain. Skin: Negative for rash. Neurological: Negative for headaches, focal weakness or numbness.   ____________________________________________   PHYSICAL EXAM:  VITAL SIGNS: ED Triage Vitals  Enc Vitals Group     BP 05/27/16 2254 101/69     Pulse Rate 05/27/16 2254 79     Resp 05/27/16 2254 18     Temp 05/27/16 2254 98.3 F (36.8 C)     Temp Source 05/27/16 2254 Oral     SpO2 05/27/16 2254 96 %     Weight 05/27/16 2250 145 lb (65.8 kg)  Height 05/27/16 2250  (1.549 m)     Head Circumference --      Peak Flow --      Pain Score 05/27/16 2250 10     Pain Loc --      Pain Edu? --      Excl. in GC? --     Constitutional: Alert and oriented. Well appearing and in Moderate distress. Eyes: Conjunctivae are normal. PERRL. EOMI. Head: Atraumatic. Nose: No congestion/rhinnorhea. Mouth/Throat: Mucous membranes are moist.  Oropharynx non-erythematous. Cardiovascular: Normal rate, regular rhythm. Grossly normal heart sounds.  Good peripheral circulation. Respiratory: Normal respiratory effort.  No retractions.  Lungs CTAB. Gastrointestinal: Soft some epigastric tenderness to palpation. No distention. Positive bowel sounds Musculoskeletal: No lower extremity tenderness nor edema.   Neurologic:  Normal speech and language.  Skin:  Skin is warm, dry and intact.  Psychiatric: Mood and affect are normal.   ____________________________________________   LABS (all labs ordered are listed, but only abnormal results are displayed)  Labs Reviewed  BASIC METABOLIC PANEL - Abnormal; Notable for the following:       Result Value   BUN 23 (*)    All other components within normal limits  LIPASE, BLOOD - Abnormal; Notable for the following:    Lipase 366 (*)    All other components within normal limits  HEPATIC FUNCTION PANEL - Abnormal; Notable for the following:    AST 234 (*)    ALT 89 (*)    Bilirubin, Direct <0.1 (*)    All other components within normal limits  CBC  TROPONIN I   ____________________________________________  EKG  ED ECG REPORT I, Rebecka Apley, the attending physician, personally viewed and interpreted this ECG.   Date: 05/27/2016  EKG Time: 2254  Rate: 80  Rhythm: normal sinus rhythm  Axis: normal  Intervals:none  ST&T Change: none  ____________________________________________  RADIOLOGY  CXR [place] abd RUQ ____________________________________________   PROCEDURES  Procedure(s) performed: None  Procedures  Critical Care performed: No  ____________________________________________   INITIAL IMPRESSION / ASSESSMENT AND PLAN / ED COURSE  Pertinent labs & imaging results that were available during my care of the patient were reviewed by me and considered in my medical decision making (see chart for details).  This is a 46 year old female who comes into the hospital today with some chest pain and epigastric pain. She reports that this started after she took some Lomotil for diarrhea. It appears that the patient has some pancreatitis. I did give her a  GI cocktail as well as some Carafate for her pain. The patient does not want narcotics. She also has some elevated liver enzymes. I did talk to the patient and she states that she had her gallbladder removed with her gastric sleeve but she may have a stones I will send her for an ultrasound to evaluate for biliary obstruction causing her pancreatitis.  Clinical Course as of May 29 247  Wed May 28, 2016  0007 No active cardiopulmonary disease. DG Chest 2 View [AW]  0155 Cholecystectomy otherwise unremarkable right upper quadrant ultrasound.   US Abdomen Limited RUQ [AW]    Clinical Course User Index [AW] Rebecka Apley, MD   The patient did start vomiting after she returned from ultrasound. Given the patient's pancreatitis and elevated liver enzymes I will admit the patient to the hospitalist service for fluid and pain control.  ____________________________________________   FINAL CLINICAL IMPRESSION(S) / ED DIAGNOSES  Final diagnoses:  Abdominal pain  Acute pancreatitis, unspecified complication status, unspecified pancreatitis type      NEW MEDICATIONS STARTED DURING THIS VISIT:  New Prescriptions   No medications on file     Note:  This document was prepared using Dragon voice recognition software and may include unintentional dictation errors.    Rebecka Apley, MD 05/28/16 [phone]

## 2016-05-28 NOTE — H&P (Signed)
Susan Stokes is an 46 y.o. female.   Chief Complaint: Abdominal pain HPI: The patient with past medical history of pancreatitis following gastric sleeve surgery presents emergency department complaining of abdominal pain. She states that her upper abdomen began to hurt yesterday. This was preceded by diarrhea which has been a chronic problem. She saw her doctor who prescribed Lomotil but since that time her pain has increased. She is also had multiple episodes of nonbloody nonbilious emesis. Vomiting causes her abdomen to hurt more. He states the pain radiates into her chest in the substernal but is not associated with shortness of breath. She has had multiple episodes of pancreatitis since undergoing obesity surgery as well as cholecystectomy. Ultrasound of the abdomen demonstrated no necrosis or bile duct dilatation. Due to inability to maintain by mouth intake the emergency department staff called the hospitalist service for admission.  Past Medical History:  Diagnosis Date  . Allergy   . Depression   . Diabetes mellitus without complication ([place])   . Diverticulitis   . Frequent headaches   . IBS (irritable bowel syndrome)   . Thyroid disease     Past Surgical History:  Procedure Laterality Date  . ABDOMINAL HYSTERECTOMY  2004  . APPENDECTOMY  2006  . BREAST REDUCTION SURGERY  2007  . CHOLECYSTECTOMY  2014  . LAPAROSCOPIC GASTRIC SLEEVE RESECTION      Family History  Problem Relation Age of Onset  . Stroke Mother   . Diabetes Mother   . Arthritis Father   . Hyperlipidemia Father   . Leukemia Paternal Aunt   . Stroke Maternal Grandmother   . Stroke Paternal Grandmother   . Cancer Paternal Grandfather     lung  . Stroke Paternal Grandfather    Social History:  reports that she has never smoked. She has never used smokeless tobacco. She reports that she does not drink alcohol or use drugs.  Allergies: No Known Allergies  Prior to Admission medications   Medication  Sig Start Date End Date Taking? Authorizing Provider  diphenoxylate-atropine (LOMOTIL) 2.5-0.025 MG tablet Take 2 tablets by mouth 3 (three) times daily before meals.   Yes Historical Provider, MD     Results for orders placed or performed during the hospital encounter of 05/27/16 (from the past 48 hour(s))  Basic metabolic panel     Status: Abnormal   Collection Time: 05/27/16 10:49 PM  Result Value Ref Range   Sodium 139 135 - 145 mmol/L   Potassium 4.3 3.5 - 5.1 mmol/L   Chloride 106 101 - 111 mmol/L   CO2 26 22 - 32 mmol/L   Glucose, Bld 99 65 - 99 mg/dL   BUN 23 (H) 6 - 20 mg/dL   Creatinine, Ser 0.57 0.44 - 1.00 mg/dL   Calcium 9.7 8.9 - 10.3 mg/dL   GFR calc non Af Amer >60 >60 mL/min   GFR calc Af Amer >60 >60 mL/min    Comment: (NOTE) The eGFR has been calculated using the CKD EPI equation. This calculation has not been validated in all clinical situations. eGFR's persistently <60 mL/min signify possible Chronic Kidney Disease.    Anion gap 7 5 - 15  CBC     Status: None   Collection Time: 05/27/16 10:49 PM  Result Value Ref Range   WBC 4.8 3.6 - 11.0 K/uL   RBC 4.44 3.80 - 5.20 MIL/uL   Hemoglobin 12.9 12.0 - 16.0 g/dL   HCT 38.5 35.0 - 47.0 %   MCV  86.6 80.0 - 100.0 fL   MCH 29.0 26.0 - 34.0 pg   MCHC 33.5 32.0 - 36.0 g/dL   RDW 13.4 11.5 - 14.5 %   Platelets 211 150 - 440 K/uL  Troponin I     Status: None   Collection Time: 05/27/16 10:49 PM  Result Value Ref Range   Troponin I <0.03 <0.03 ng/mL  Lipase, blood     Status: Abnormal   Collection Time: 05/27/16 10:49 PM  Result Value Ref Range   Lipase 366 (H) 11 - 51 U/L  Hepatic function panel     Status: Abnormal   Collection Time: 05/27/16 10:49 PM  Result Value Ref Range   Total Protein 7.1 6.5 - 8.1 g/dL   Albumin 4.1 3.5 - 5.0 g/dL   AST 234 (H) 15 - 41 U/L   ALT 89 (H) 14 - 54 U/L   Alkaline Phosphatase 63 38 - 126 U/L   Total Bilirubin 0.3 0.3 - 1.2 mg/dL   Bilirubin, Direct <0.1 (L) 0.1 - 0.5  mg/dL   Indirect Bilirubin NOT CALCULATED 0.3 - 0.9 mg/dL   Dg Chest 2 View  Result Date: 05/27/2016 CLINICAL DATA:  Chest pain and dyspnea after taking medication for irritable bowel syndrome EXAM: CHEST  2 VIEW COMPARISON:  08/04/2012 FINDINGS: The heart size and mediastinal contours are within normal limits. Both lungs are clear. The visualized skeletal structures are unremarkable. IMPRESSION: No active cardiopulmonary disease. Electronically Signed   By: Ashley Royalty M.D.   On: 05/27/2016 23:18   US Abdomen Limited Ruq  Result Date: 05/28/2016 CLINICAL DATA:  46 year old female with right upper quadrant abdominal pain. EXAM: US ABDOMEN LIMITED - RIGHT UPPER QUADRANT COMPARISON:  Abdominal ultrasound dated 08/04/2012 FINDINGS: Gallbladder: Cholecystectomy. Common bile duct: Diameter: 5 mm Liver: No focal lesion identified. Within normal limits in parenchymal echogenicity. IMPRESSION: Cholecystectomy otherwise unremarkable right upper quadrant ultrasound. Electronically Signed   By: Anner Crete M.D.   On: 05/28/2016 01:51    Review of Systems  Constitutional: Negative for chills and fever.  HENT: Negative for sore throat and tinnitus.   Eyes: Negative for blurred vision and redness.  Respiratory: Negative for cough and shortness of breath.   Cardiovascular: Negative for chest pain, palpitations, orthopnea and PND.  Gastrointestinal: Positive for abdominal pain, nausea and vomiting. Negative for diarrhea.  Genitourinary: Negative for dysuria, frequency and urgency.  Musculoskeletal: Negative for joint pain and myalgias.  Skin: Negative for rash.       No lesions  Neurological: Negative for speech change, focal weakness and weakness.  Endo/Heme/Allergies: Does not bruise/bleed easily.       No temperature intolerance  Psychiatric/Behavioral: Negative for depression and suicidal ideas.    Blood pressure 94/74, pulse 78, temperature 98.3 F (36.8 C), temperature source Oral, resp.  rate (!) 22, height '5\' 1"'  (1.549 m), weight 65.8 kg (145 lb), SpO2 100 %. Physical Exam  Vitals reviewed. Constitutional: She is oriented to person, place, and time. She appears well-developed and well-nourished. No distress.  HENT:  Head: Normocephalic and atraumatic.  Mouth/Throat: Oropharynx is clear and moist.  Eyes: Conjunctivae and EOM are normal. Pupils are equal, round, and reactive to light. No scleral icterus.  Neck: Normal range of motion. Neck supple. No JVD present. No tracheal deviation present. No thyromegaly present.  Cardiovascular: Normal rate, regular rhythm and normal heart sounds.  Exam reveals no gallop and no friction rub.   No murmur heard. Respiratory: Effort normal and breath sounds normal.  GI: Soft. Bowel sounds are normal. She exhibits no distension. There is no tenderness.  Genitourinary:  Genitourinary Comments: Deferred  Musculoskeletal: Normal range of motion. She exhibits no edema.  Lymphadenopathy:    She has no cervical adenopathy.  Neurological: She is alert and oriented to person, place, and time. No cranial nerve deficit. She exhibits normal muscle tone.  Skin: Skin is warm and dry. No rash noted. No erythema.  Psychiatric: She has a normal mood and affect. Her behavior is normal. Judgment and thought content normal.     Assessment/Plan This is a 46 year old female admitted for pancreatitis. 1. Pancreatitis: Kidney function and electrolytes stable. The patient is nothing by mouth and we will hydrate with intravenous fluid. Manage nausea and pain. 2. 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 Hydrate with intravenous fluid. 3. Transaminitis: Nonspecific; no evidence of intrahepatic stone formation 4. History of thyroid disease: Check TSH 5. History of diabetes: Presumably resolved following gastric surgery. Check hemoglobin A1c 6. DVT prophylaxis: Lovenox 7. GI prophylaxis: None The patient is a full code. Time spent on  admission orders and patient care approximately 45 minutes  Harrie Foreman, MD 05/28/2016, 2:22 AM

## 2016-05-28 NOTE — Progress Notes (Signed)
Received MDF order to discharge patient to home reviewed discharge instructions, prescriptions and homes meds with patient and she verbalized understanding discharged to home with family

## 2016-05-28 NOTE — Progress Notes (Signed)
[facility] [place] at [facility]   PATIENT NAME: Tinaya Ceballos    MR#:  [id number]  DATE OF BIRTH:  12/08/1970  SUBJECTIVE:  CHIEF COMPLAINT:   Chief Complaint  Patient presents with  . Chest Pain  . Nausea    pt came with c/o abdominal pain and nausea.  She have multiple Otitis episodes since she had gastric sleeve surgery, she will also have chronic diarrhea since the surgery.  REVIEW OF SYSTEMS:  CONSTITUTIONAL: No fever, fatigue or weakness.  EYES: No blurred or double vision.  EARS, NOSE, AND THROAT: No tinnitus or ear pain.  RESPIRATORY: No cough, shortness of breath, wheezing or hemoptysis.  CARDIOVASCULAR: No chest pain, orthopnea, edema.  GASTROINTESTINAL: Positive for nausea, vomiting, diarrhea or abdominal pain.  GENITOURINARY: No dysuria, hematuria.  ENDOCRINE: No polyuria, nocturia,  HEMATOLOGY: No anemia, easy bruising or bleeding SKIN: No rash or lesion. MUSCULOSKELETAL: No joint pain or arthritis.   NEUROLOGIC: No tingling, numbness, weakness.  PSYCHIATRY: No anxiety or depression.   ROS  DRUG ALLERGIES:  No Known Allergies  VITALS:  Blood pressure 106/63, pulse 82, temperature 98.3 F (36.8 C), temperature source Oral, resp. rate 18, height  (1.549 m), weight 70.8 kg (156 lb 1.6 oz), SpO2 99 %.  PHYSICAL EXAMINATION:  GENERAL:  46 y.o.-year-old patient lying in the bed with no acute distress.  EYES: Pupils equal, round, reactive to light and accommodation. No scleral icterus. Extraocular muscles intact.  HEENT: Head atraumatic, normocephalic. Oropharynx and nasopharynx clear.  NECK:  Supple, no jugular venous distention. No thyroid enlargement, no tenderness.  LUNGS: Normal breath sounds bilaterally, no wheezing, rales,rhonchi or crepitation. No use of accessory muscles of respiration.  CARDIOVASCULAR: S1, S2 normal. No murmurs, rubs, or gallops.  ABDOMEN: Soft, mild tender, nondistended. Bowel sounds present. No  organomegaly or mass.  EXTREMITIES: No pedal edema, cyanosis, or clubbing.  NEUROLOGIC: Cranial nerves II through XII are intact. Muscle strength 5/5 in all extremities. Sensation intact. Gait not checked.  PSYCHIATRIC: The patient is alert and oriented x 3.  SKIN: No obvious rash, lesion, or ulcer.   Physical Exam LABORATORY PANEL:   CBC  Recent Labs Lab 05/27/16 2249  WBC 4.8  HGB 12.9  HCT 38.5  PLT 211   ------------------------------------------------------------------------------------------------------------------  Chemistries   Recent Labs Lab 05/27/16 2249  NA 139  K 4.3  CL 106  CO2 26  GLUCOSE 99  BUN 23*  CREATININE 0.57  CALCIUM 9.7  AST 234*  ALT 89*  ALKPHOS 63  BILITOT 0.3   ------------------------------------------------------------------------------------------------------------------  Cardiac Enzymes  Recent Labs Lab 05/27/16 2249  TROPONINI <0.03   ------------------------------------------------------------------------------------------------------------------  RADIOLOGY:  Dg Chest 2 View  Result Date: 05/27/2016 CLINICAL DATA:  Chest pain and dyspnea after taking medication for irritable bowel syndrome EXAM: CHEST  2 VIEW COMPARISON:  08/04/2012 FINDINGS: The heart size and mediastinal contours are within normal limits. Both lungs are clear. The visualized skeletal structures are unremarkable. IMPRESSION: No active cardiopulmonary disease. Electronically Signed   By: Tollie Eth M.D.   On: 05/27/2016 23:18   US Abdomen Limited Ruq  Result Date: 05/28/2016 CLINICAL DATA:  46 year old female with right upper quadrant abdominal pain. EXAM: US ABDOMEN LIMITED - RIGHT UPPER QUADRANT COMPARISON:  Abdominal ultrasound dated 08/04/2012 FINDINGS: Gallbladder: Cholecystectomy. Common bile duct: Diameter: 5 mm Liver: No focal lesion identified. Within normal limits in parenchymal echogenicity. IMPRESSION: Cholecystectomy otherwise unremarkable  right upper quadrant ultrasound. Electronically Signed   By: Burtis Junes  Radparvar M.D.   On: 05/28/2016 01:51    ASSESSMENT AND PLAN:   Active Problems:   Pancreatitis  This is a 46 year old female admitted for pancreatitis. 1. Acute Pancreatitis: Kidney function and electrolytes stable. The patient is nothing by mouth and we will hydrate with intravenous fluid. Manage nausea and pain. 2. 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 Hydrate with intravenous fluid. 3. Transaminitis: Nonspecific; no evidence of intrahepatic stone formation 4. History of thyroid disease: Check TSH 5. History of diabetes: Presumably resolved following gastric surgery. Check hemoglobin A1c 6. DVT prophylaxis: Lovenox 7. GI prophylaxis: None   All the records are reviewed and case discussed with Care Management/Social Workerr. Management plans discussed with the patient, family and they are in agreement.  CODE STATUS: Full.  TOTAL TIME TAKING CARE OF THIS PATIENT: 35 minutes.     POSSIBLE D/C IN 1-2 DAYS, DEPENDING ON CLINICAL CONDITION.   Altamese Dilling M.D on 05/28/2016   Between 7am to 6pm - Pager - [phone]  After 6pm go to [email or web address] - password [email or web address]  [facility]  Office  [phone]  CC: Primary care physician; Pcp Not In System  Note: This dictation was prepared with Dragon dictation along with smaller phrase technology. Any transcriptional errors that result from this process are unintentional.

## 2016-05-28 NOTE — Progress Notes (Signed)
Patients diet was advanced and patient tolerated new diet well.

## 2016-05-28 NOTE — Discharge Summary (Signed)
[facility] [place] at [facility]   PATIENT NAME: Susan Stokes    MR#:  [id number]  DATE OF BIRTH:  November 14, 1970  DATE OF ADMISSION:  05/27/2016 ADMITTING PHYSICIAN: Arnaldo Natal, MD  DATE OF DISCHARGE:05/28/2016  PRIMARY CARE PHYSICIAN: Pcp Not In System    ADMISSION DIAGNOSIS:  Abdominal pain [R10.9] Acute pancreatitis, unspecified complication status, unspecified pancreatitis type [K85.90]  DISCHARGE DIAGNOSIS:  Active Problems:   Pancreatitis   SECONDARY DIAGNOSIS:   Past Medical History:  Diagnosis Date  . Allergy   . Depression   . Diabetes mellitus without complication ([facility])   . Diverticulitis   . Frequent headaches   . IBS (irritable bowel syndrome)   . Thyroid disease     HOSPITAL COURSE:   This is a 46 year old female admitted for pancreatitis. 1. Acute Pancreatitis: Kidney function and electrolytes stable. The patient is nothing by mouth and we will hydrate with intravenous fluid. Manage nausea and pain.  improved, tolerated diet. 2. 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 Hydrate with intravenous fluid. 3. Transaminitis: Nonspecific; no evidence of intrahepatic stone formation 4. History of thyroid disease: Checked TSH 5. History of diabetes: Presumably resolved following gastric surgery. sent hemoglobin A1c 6. DVT prophylaxis: Lovenox 7. GI prophylaxis: None  DISCHARGE CONDITIONS:   Stable.  CONSULTS OBTAINED:    DRUG ALLERGIES:  No Known Allergies  DISCHARGE MEDICATIONS:   Current Discharge Medication List    START taking these medications   Details  ondansetron (ZOFRAN) 4 MG tablet Take 1 tablet (4 mg total) by mouth every 6 (six) hours as needed for nausea. Qty: 20 tablet, Refills: 0      CONTINUE these medications which have NOT CHANGED   Details  diphenoxylate-atropine (LOMOTIL) 2.5-0.025 MG tablet Take 2 tablets by mouth 3 (three) times daily before meals.          DISCHARGE INSTRUCTIONS:    Follow with PMD in 1-2 weeks.  If you experience worsening of your admission symptoms, develop shortness of breath, life threatening emergency, suicidal or homicidal thoughts you must seek medical attention immediately by calling 911 or calling your MD immediately  if symptoms less severe.  You Must read complete instructions/literature along with all the possible adverse reactions/side effects for all the Medicines you take and that have been prescribed to you. Take any new Medicines after you have completely understood and accept all the possible adverse reactions/side effects.   Please note  You were cared for by a hospitalist during your hospital stay. If you have any questions about your discharge medications or the care you received while you were in the hospital after you are discharged, you can call the unit and asked to speak with the hospitalist on call if the hospitalist that took care of you is not available. Once you are discharged, your primary care physician will handle any further medical issues. Please note that NO REFILLS for any discharge medications will be authorized once you are discharged, as it is imperative that you return to your primary care physician (or establish a relationship with a primary care physician if you do not have one) for your aftercare needs so that they can reassess your need for medications and monitor your lab values.    Today   CHIEF COMPLAINT:   Chief Complaint  Patient presents with  . Chest Pain  . Nausea    HISTORY OF PRESENT ILLNESS:  Susan Stokes  is a 46 y.o. female with  a known history of pancreatitis following gastric sleeve surgery presents emergency department complaining of abdominal pain. She states that her upper abdomen began to hurt yesterday. This was preceded by diarrhea which has been a chronic problem. She saw her doctor who prescribed Lomotil but since that time her pain has  increased. She is also had multiple episodes of nonbloody nonbilious emesis. Vomiting causes her abdomen to hurt more. He states the pain radiates into her chest in the substernal but is not associated with shortness of breath. She has had multiple episodes of pancreatitis since undergoing obesity surgery as well as cholecystectomy. Ultrasound of the abdomen demonstrated no necrosis or bile duct dilatation. Due to inability to maintain by mouth intake the emergency department staff called the hospitalist service for admission.   VITAL SIGNS:  Blood pressure (!) 93/52, pulse (!) 57, temperature 98.3 F (36.8 C), temperature source Oral, resp. rate 18, height  (1.549 m), weight 70.8 kg (156 lb 1.6 oz), SpO2 99 %.  I/O:   Intake/Output Summary (Last 24 hours) at 05/28/16 1614 Last data filed at 05/28/16 1200  Gross per 24 hour  Intake           405.42 ml  Output                0 ml  Net           405.42 ml    PHYSICAL EXAMINATION:  GENERAL:  46 y.o.-year-old patient lying in the bed with no acute distress.  EYES: Pupils equal, round, reactive to light and accommodation. No scleral icterus. Extraocular muscles intact.  HEENT: Head atraumatic, normocephalic. Oropharynx and nasopharynx clear.  NECK:  Supple, no jugular venous distention. No thyroid enlargement, no tenderness.  LUNGS: Normal breath sounds bilaterally, no wheezing, rales,rhonchi or crepitation. No use of accessory muscles of respiration.  CARDIOVASCULAR: S1, S2 normal. No murmurs, rubs, or gallops.  ABDOMEN: Soft, non-tender, non-distended. Bowel sounds present. No organomegaly or mass.  EXTREMITIES: No pedal edema, cyanosis, or clubbing.  NEUROLOGIC: Cranial nerves II through XII are intact. Muscle strength 5/5 in all extremities. Sensation intact. Gait not checked.  PSYCHIATRIC: The patient is alert and oriented x 3.  SKIN: No obvious rash, lesion, or ulcer.   DATA REVIEW:   CBC  Recent Labs Lab 05/27/16 2249  WBC  4.8  HGB 12.9  HCT 38.5  PLT 211    Chemistries   Recent Labs Lab 05/27/16 2249  NA 139  K 4.3  CL 106  CO2 26  GLUCOSE 99  BUN 23*  CREATININE 0.57  CALCIUM 9.7  AST 234*  ALT 89*  ALKPHOS 63  BILITOT 0.3    Cardiac Enzymes  Recent Labs Lab 05/27/16 2249  TROPONINI <0.03    Microbiology Results  No results found for this or any previous visit.  RADIOLOGY:  Dg Chest 2 View  Result Date: 05/27/2016 CLINICAL DATA:  Chest pain and dyspnea after taking medication for irritable bowel syndrome EXAM: CHEST  2 VIEW COMPARISON:  08/04/2012 FINDINGS: The heart size and mediastinal contours are within normal limits. Both lungs are clear. The visualized skeletal structures are unremarkable. IMPRESSION: No active cardiopulmonary disease. Electronically Signed   By: Tollie Eth M.D.   On: 05/27/2016 23:18   US Abdomen Limited Ruq  Result Date: 05/28/2016 CLINICAL DATA:  46 year old female with right upper quadrant abdominal pain. EXAM: US ABDOMEN LIMITED - RIGHT UPPER QUADRANT COMPARISON:  Abdominal ultrasound dated 08/04/2012 FINDINGS: Gallbladder: Cholecystectomy. Common bile duct:  Diameter: 5 mm Liver: No focal lesion identified. Within normal limits in parenchymal echogenicity. IMPRESSION: Cholecystectomy otherwise unremarkable right upper quadrant ultrasound. Electronically Signed   By: Elgie Collard M.D.   On: 05/28/2016 01:51    EKG:   Orders placed or performed during the hospital encounter of 05/27/16  . ED EKG within 10 minutes  . ED EKG within 10 minutes  . EKG 12-Lead  . EKG 12-Lead      Management plans discussed with the patient, family and they are in agreement.  CODE STATUS:     Code Status Orders        Start     Ordered   05/28/16 0346  Full code  Continuous     05/28/16 0345    Code Status History    Date Active Date Inactive Code Status Order ID Comments User Context   This patient has a current code status but no historical code  status.      TOTAL TIME TAKING CARE OF THIS PATIENT: 35 minutes.    Altamese Dilling M.D on 05/28/2016 at 4:14 PM  Between 7am to 6pm - Pager - [phone]  After 6pm go to [email or web address] - password [email or web address]  [facility]  Office  [phone]  CC: Primary care physician; Pcp Not In System   Note: This dictation was prepared with Dragon dictation along with smaller phrase technology. Any transcriptional errors that result from this process are unintentional.

## 2016-05-28 NOTE — Plan of Care (Signed)
Problem: Education: Goal: Knowledge of [facility] Education information/materials will improve Outcome: Progressing Pt likes to be called Susan Stokes  Past Medical History:  Diagnosis Date  . Allergy   . Depression   . Diabetes mellitus without complication ([facility])   . Diverticulitis   . Frequent headaches   . IBS (irritable bowel syndrome)   . Thyroid disease    Pt is well controlled with home medication

## 2016-05-29 LAB — HIV ANTIBODY (ROUTINE TESTING W REFLEX): HIV Screen 4th Generation wRfx: NONREACTIVE

## 2016-05-29 LAB — HEMOGLOBIN A1C
Hgb A1c MFr Bld: 5.2 % (ref 4.8–5.6)
Mean Plasma Glucose: 103 mg/dL

## 2016-06-20 ENCOUNTER — Other Ambulatory Visit
Admission: RE | Admit: 2016-06-20 | Discharge: 2016-06-20 | Disposition: A | Discharge status: Home / Self Care | Payer: BLUE CROSS/BLUE SHIELD | Source: Ambulatory Visit | Attending: Student | Admitting: Student

## 2016-06-20 DIAGNOSIS — K529 Noninfective gastroenteritis and colitis, unspecified: Secondary | ICD-10-CM | POA: Insufficient documentation

## 2016-06-20 LAB — GASTROINTESTINAL PANEL BY PCR, STOOL (REPLACES STOOL CULTURE)
ADENOVIRUS F40/41: NOT DETECTED
Astrovirus: NOT DETECTED
CAMPYLOBACTER SPECIES: NOT DETECTED
CRYPTOSPORIDIUM: NOT DETECTED
CYCLOSPORA CAYETANENSIS: NOT DETECTED
ENTEROPATHOGENIC E COLI (EPEC): NOT DETECTED
Entamoeba histolytica: NOT DETECTED
Enteroaggregative E coli (EAEC): NOT DETECTED
Enterotoxigenic E coli (ETEC): NOT DETECTED
Giardia lamblia: NOT DETECTED
Norovirus GI/GII: NOT DETECTED
PLESIMONAS SHIGELLOIDES: NOT DETECTED
ROTAVIRUS A: NOT DETECTED
SALMONELLA SPECIES: NOT DETECTED
SAPOVIRUS (I, II, IV, AND V): NOT DETECTED
SHIGA LIKE TOXIN PRODUCING E COLI (STEC): NOT DETECTED
SHIGELLA/ENTEROINVASIVE E COLI (EIEC): NOT DETECTED
VIBRIO SPECIES: NOT DETECTED
Vibrio cholerae: NOT DETECTED
YERSINIA ENTEROCOLITICA: NOT DETECTED

## 2016-06-20 LAB — C DIFFICILE QUICK SCREEN W PCR REFLEX
C DIFFICILE (CDIFF) INTERP: NOT DETECTED
C DIFFICILE (CDIFF) TOXIN: NEGATIVE
C Diff antigen: NEGATIVE

## 2016-06-24 LAB — MISC LABCORP TEST (SEND OUT): Labcorp test code: 123255

## 2018-01-02 IMAGING — US US ABDOMEN LIMITED
1 series · 14 of 25 positions shown · non-contrast
Comparison: Abdominal ultrasound dated 08/04/2012

CLINICAL DATA: 45-year-old female with right upper quadrant
abdominal pain.

EXAM:
US ABDOMEN LIMITED - RIGHT UPPER QUADRANT

[Series 1: us abdomen limited · 0.20mm/px · 14 of 32 slices shown]
[im 1/32]
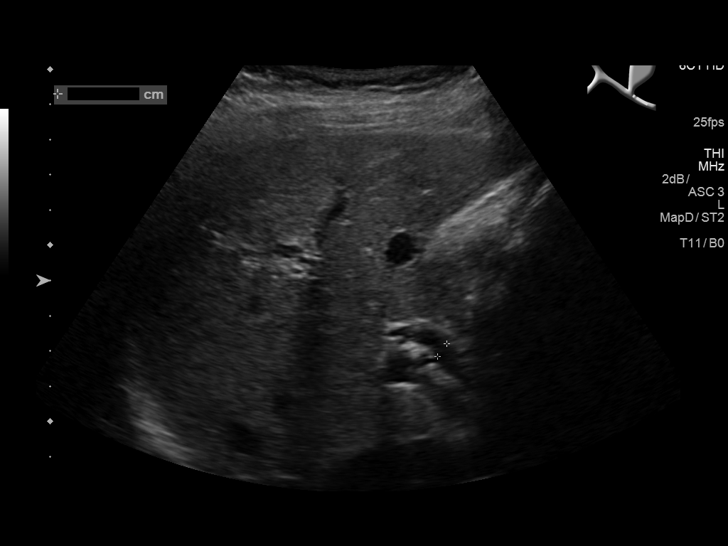
[im 3/32]
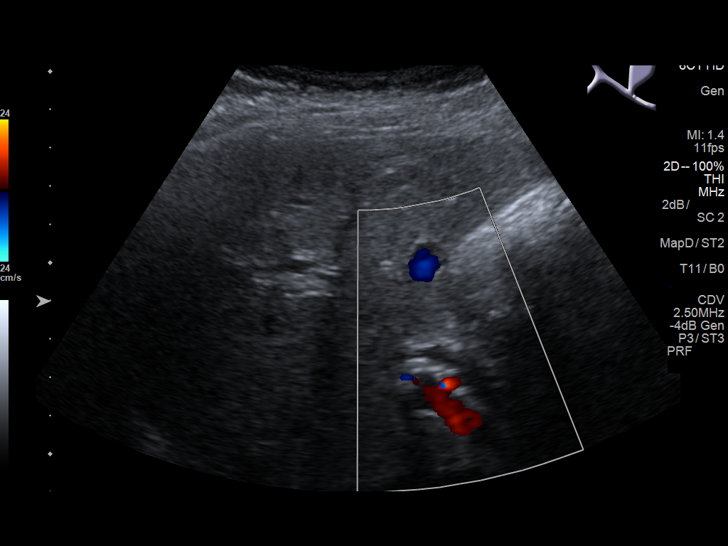
[im 6/32]
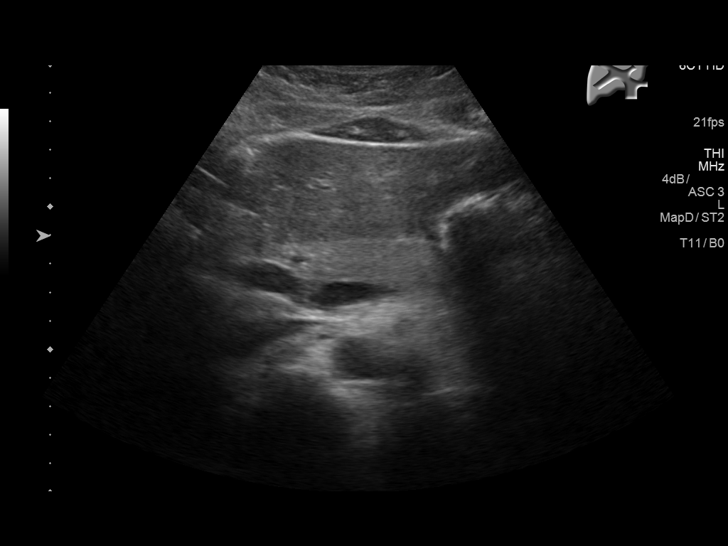
[im 8/32]
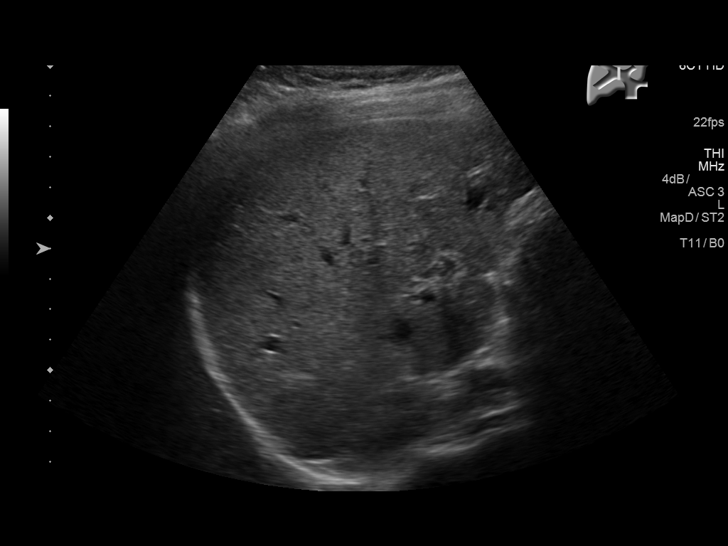
[im 11/32]
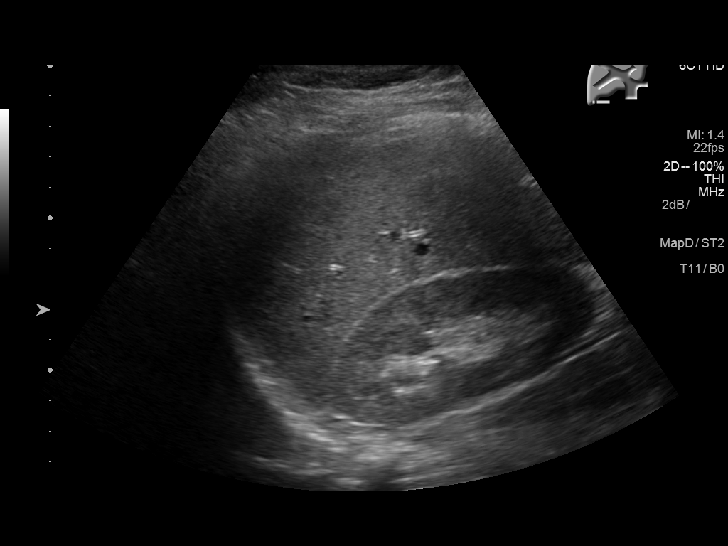
[im 12/32]
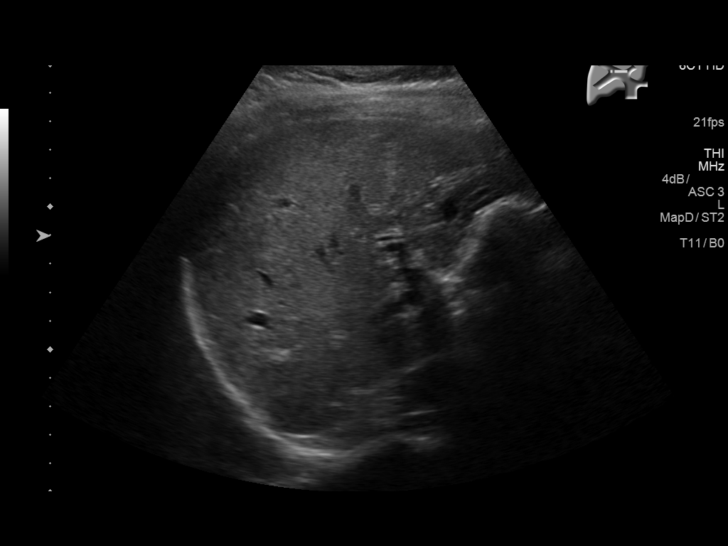
[im 15/32]
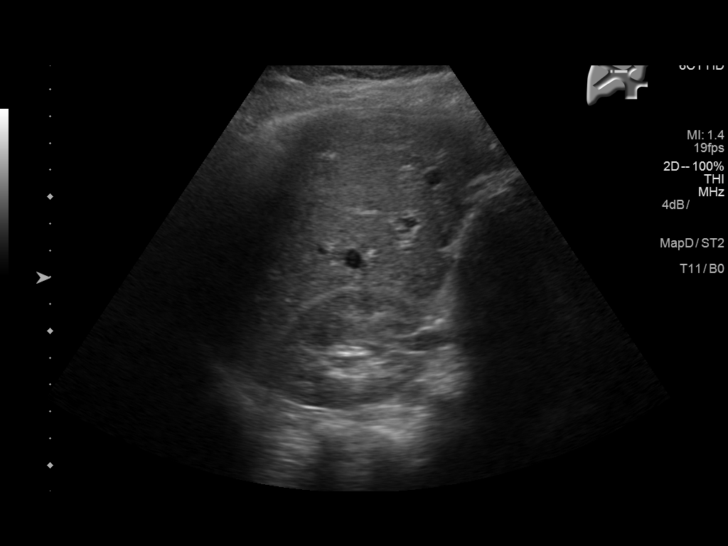
[im 17/32]
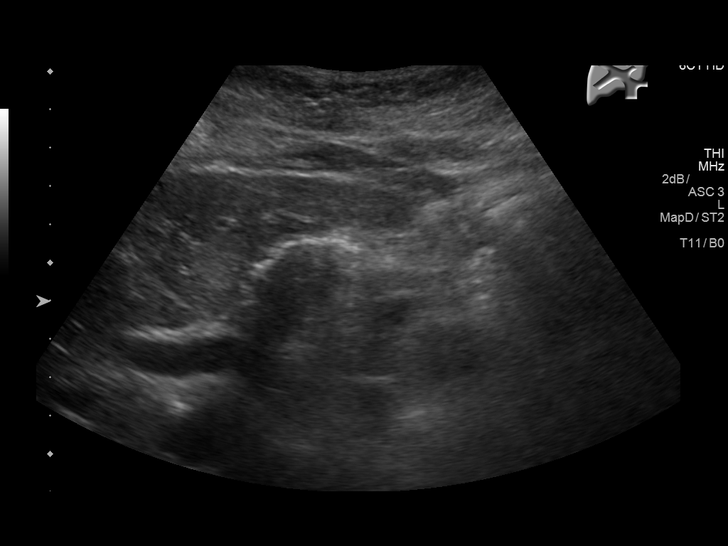
[im 20/32]
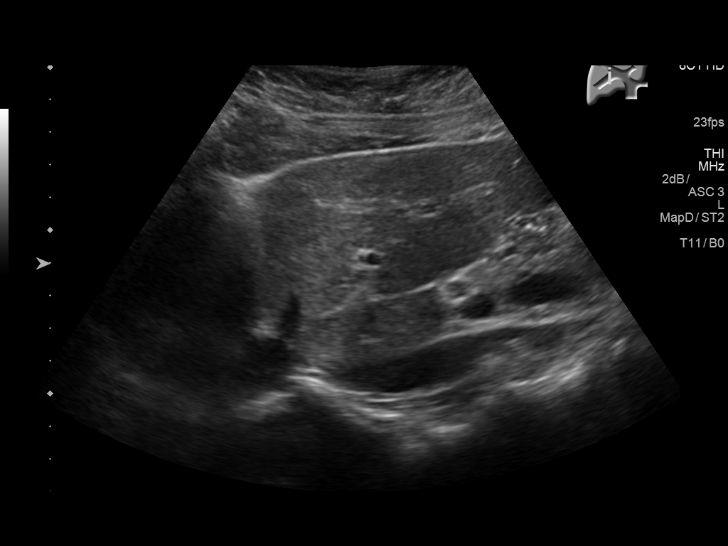
[im 21/32]
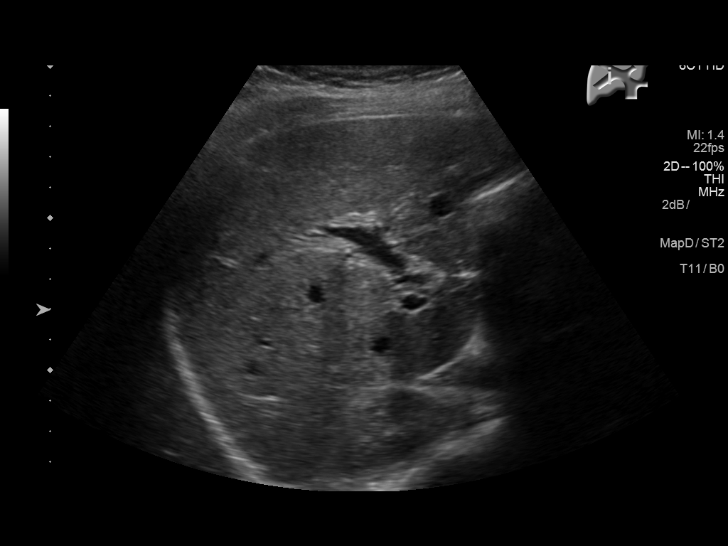
[im 24/32]
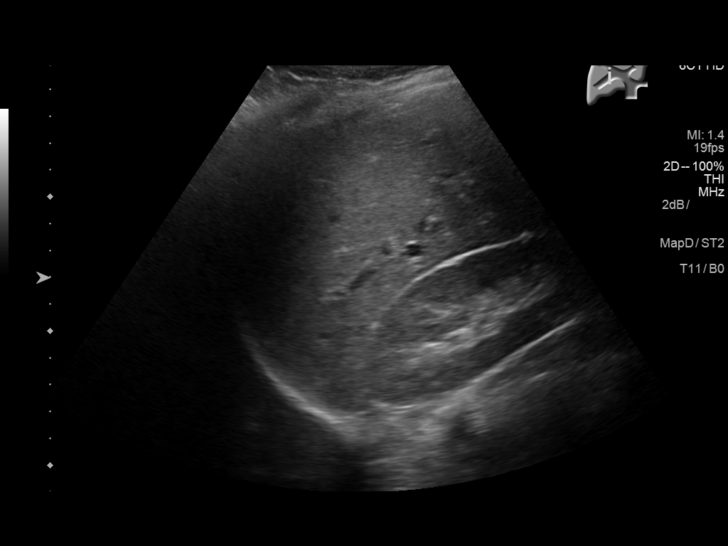
[im 26/32]
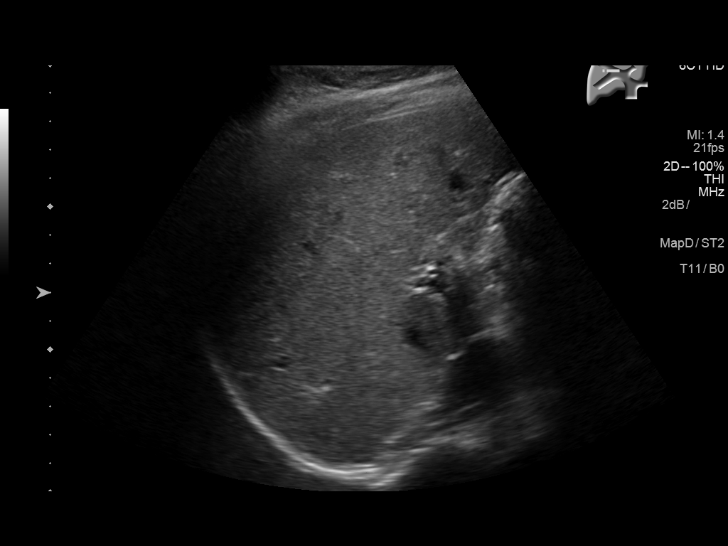
[im 29/32]
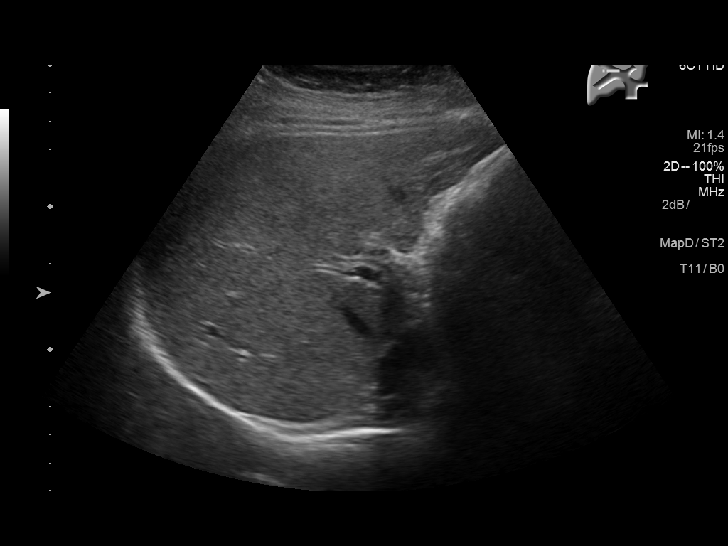
[im 32/32]
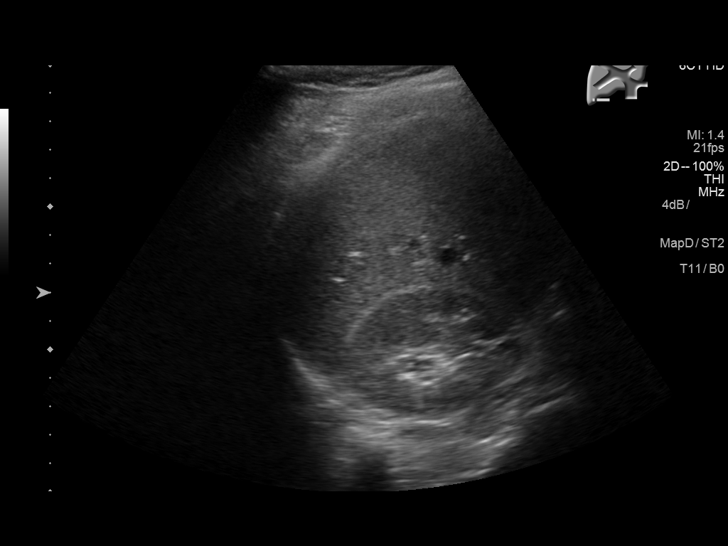

[14 of 25 positions shown; findings below may reference images not displayed]

FINDINGS: Gallbladder:

Cholecystectomy.

Common bile duct:

Diameter: 5 mm

Liver:

No focal lesion identified. Within normal limits in parenchymal
echogenicity.
IMPRESSION: Cholecystectomy otherwise unremarkable right upper quadrant
ultrasound.

## 2018-08-13 ENCOUNTER — Other Ambulatory Visit: Payer: Self-pay | Admitting: Student

## 2018-08-13 DIAGNOSIS — M5416 Radiculopathy, lumbar region: Secondary | ICD-10-CM

## 2018-08-29 ENCOUNTER — Ambulatory Visit
Admission: RE | Admit: 2018-08-29 | Discharge: 2018-08-29 | Disposition: A | Discharge status: Home / Self Care | Payer: BC Managed Care – PPO | Source: Ambulatory Visit | Attending: Student | Admitting: Student

## 2018-08-29 ENCOUNTER — Other Ambulatory Visit: Payer: Self-pay

## 2018-08-29 DIAGNOSIS — M5416 Radiculopathy, lumbar region: Secondary | ICD-10-CM | POA: Insufficient documentation

## 2018-09-15 ENCOUNTER — Ambulatory Visit: Payer: BC Managed Care – PPO | Admitting: Urology

## 2018-09-15 ENCOUNTER — Encounter

## 2018-09-15 ENCOUNTER — Encounter: Payer: Self-pay | Admitting: Urology

## 2018-10-13 ENCOUNTER — Encounter: Payer: Self-pay | Admitting: Urology

## 2018-10-13 ENCOUNTER — Ambulatory Visit (INDEPENDENT_AMBULATORY_CARE_PROVIDER_SITE_OTHER): Payer: BC Managed Care – PPO | Admitting: Urology

## 2018-10-13 ENCOUNTER — Other Ambulatory Visit: Payer: Self-pay

## 2018-10-13 ENCOUNTER — Encounter

## 2018-10-13 VITALS — BP 95/62 | HR 72 | Ht 61.0 in | Wt 160.0 lb

## 2018-10-13 DIAGNOSIS — K824 Cholesterolosis of gallbladder: Secondary | ICD-10-CM | POA: Insufficient documentation

## 2018-10-13 DIAGNOSIS — R809 Proteinuria, unspecified: Secondary | ICD-10-CM

## 2018-10-13 NOTE — Progress Notes (Signed)
10/13/2018 2:55 PM   Susan Stokes Jul 13, 1970 [id number]  Referring provider: Alan MulderMorayati, Shamil J, MD [address] [place] [id number]  Chief Complaint  Patient presents with  . New Patient (Initial Visit)    HPI:  Susan StanleyLisa was referred over for 2+ WBC on a recent UA urine dip.  Urine culture was negative.  She took Levaquin.  It was also mentioned that she had proteinuria and nephrology and referral was being considered.  Her creatinine was 0.66.  [facility] and by report this is normal. She mainly drink water. She drinks crystal light drink mix. She alternates between constipation and diarrhea. UA today is clear. She voids with a good flow. She has no frequency or urgency. No hematuria. UA is clear today -- no protein,LE, N. No rbc or wbc. No gross hematuria.   She complains of right hip pain for about 6 months after starting at home work outs. She was told about a femoral hernia. She saw orthopaedics and MRI was + for L5-S1 issues. Radiates down front of left leg. Hurts to press on front of leg.   Modifying factors: There are no other modifying factors  Associated signs and symptoms: There are no other associated signs and symptoms Aggravating and relieving factors: There are no other aggravating or relieving factors Severity: Moderate Duration: Persistent   PMH: Past Medical History:  Diagnosis Date  . Allergy   . Depression   . Diabetes mellitus without complication (HCC)   . Diverticulitis   . Frequent headaches   . IBS (irritable bowel syndrome)   . Thyroid disease     Surgical History: Past Surgical History:  Procedure Laterality Date  . ABDOMINAL HYSTERECTOMY  2004  . APPENDECTOMY  2006  . BREAST REDUCTION SURGERY  2007  . CHOLECYSTECTOMY  2014  . LAPAROSCOPIC GASTRIC SLEEVE RESECTION      Home Medications:  Allergies as of 10/13/2018   No Known Allergies     Medication List       Accurate as of October 13, 2018   2:55 PM. If you have any questions, ask your nurse or doctor.        diphenoxylate-atropine 2.5-0.025 MG tablet Commonly known as: LOMOTIL Take 2 tablets by mouth 3 (three) times daily before meals.   ondansetron 4 MG tablet Commonly known as: ZOFRAN Take 1 tablet (4 mg total) by mouth every 6 (six) hours as needed for nausea.       Allergies: No Known Allergies  Family History: Family History  Problem Relation Age of Onset  . Stroke Mother   . Diabetes Mother   . Arthritis Father   . Hyperlipidemia Father   . Leukemia Paternal Aunt   . Stroke Maternal Grandmother   . Stroke Paternal Grandmother   . Cancer Paternal Grandfather        lung  . Stroke Paternal Grandfather     Social History:  reports that she has never smoked. She has never used smokeless tobacco. She reports that she does not drink alcohol or use drugs.  ROS:                                        Physical Exam: There were no vitals taken for this visit.  Constitutional:  Alert and oriented, No acute distress. HEENT: [place] AT, moist mucus membranes.  Trachea midline, no masses. Cardiovascular: No clubbing, cyanosis, or edema. Respiratory: Normal respiratory effort, no increased work of breathing. GI: Abdomen is soft, nontender, nondistended, no abdominal masses Skin: No rashes, bruises or suspicious lesions. Neurologic: Grossly intact, no focal deficits, moving all 4 extremities. Psychiatric: Normal mood and affect.  Laboratory Data: Lab Results  Component Value Date   WBC 4.8 05/27/2016   HGB 12.9 05/27/2016   HCT 38.5 05/27/2016   MCV 86.6 05/27/2016   PLT 211 05/27/2016    Lab Results  Component Value Date   CREATININE 0.57 05/27/2016    No results found for: PSA  No results found for: TESTOSTERONE  Lab Results  Component Value Date   HGBA1C 5.2 05/28/2016    Urinalysis No results found for: COLORURINE, APPEARANCEUR, LABSPEC, PHURINE, GLUCOSEU, HGBUR,  BILIRUBINUR, KETONESUR, PROTEINUR, UROBILINOGEN, NITRITE, LEUKOCYTESUR  No results found for: LABMICR, [place], RBCUA, LABEPIT, MUCUS, BACTERIA  Pertinent Imaging: CT report  No results found for this or any previous visit. No results found for this or any previous visit. No results found for this or any previous visit. No results found for this or any previous visit. No results found for this or any previous visit. No results found for this or any previous visit. No results found for this or any previous visit. No results found for this or any previous visit.  Assessment & Plan:    Pyuria - cleared. No LUTS. No gross hematuria. Negative imaging by report. Discussed cystoscopy and she will hold off. If concerns for femoral hernia persist would recommend referral to general surgery. She will return as needed.    No follow-ups on file.  Festus Aloe, MD  [facility] [address], [place], [place] [address] [phone]

## 2018-10-14 LAB — URINALYSIS, COMPLETE
Bilirubin, UA: NEGATIVE
Glucose, UA: NEGATIVE
Leukocytes,UA: NEGATIVE
Nitrite, UA: NEGATIVE
Protein,UA: NEGATIVE
Specific Gravity, UA: >1.03 — ABNORMAL HIGH (ref 1.005–1.030)
Urobilinogen, Ur: 0.2 mg/dL (ref 0.2–1.0)
pH, UA: 5 (ref 5.0–7.5)

## 2018-10-14 LAB — MICROSCOPIC EXAMINATION
Bacteria, UA: NONE SEEN
RBC: NONE SEEN /hpf (ref 0–2)

## 2019-04-12 ENCOUNTER — Other Ambulatory Visit: Payer: Self-pay | Admitting: Orthopedic Surgery

## 2019-04-12 DIAGNOSIS — M25552 Pain in left hip: Secondary | ICD-10-CM

## 2019-04-18 ENCOUNTER — Other Ambulatory Visit: Payer: Self-pay | Admitting: Orthopedic Surgery

## 2019-04-18 ENCOUNTER — Other Ambulatory Visit: Payer: Self-pay

## 2019-04-18 ENCOUNTER — Ambulatory Visit
Admission: RE | Admit: 2019-04-18 | Discharge: 2019-04-18 | Disposition: A | Discharge status: Home / Self Care | Payer: BC Managed Care – PPO | Source: Ambulatory Visit | Attending: Orthopedic Surgery | Admitting: Orthopedic Surgery

## 2019-04-18 DIAGNOSIS — M25552 Pain in left hip: Secondary | ICD-10-CM

## 2019-04-19 ENCOUNTER — Other Ambulatory Visit: Payer: Self-pay | Admitting: Orthopedic Surgery

## 2019-04-21 ENCOUNTER — Encounter
Admission: RE | Admit: 2019-04-21 | Discharge: 2019-04-21 | Disposition: A | Discharge status: Home / Self Care | Payer: BC Managed Care – PPO | Source: Ambulatory Visit | Attending: Orthopedic Surgery | Admitting: Orthopedic Surgery

## 2019-04-21 ENCOUNTER — Other Ambulatory Visit: Payer: Self-pay

## 2019-04-21 DIAGNOSIS — Z01812 Encounter for preprocedural laboratory examination: Secondary | ICD-10-CM | POA: Insufficient documentation

## 2019-04-21 DIAGNOSIS — Z20822 Contact with and (suspected) exposure to covid-19: Secondary | ICD-10-CM | POA: Diagnosis not present

## 2019-04-21 HISTORY — DX: Gastro-esophageal reflux disease without esophagitis: K21.9

## 2019-04-21 NOTE — Patient Instructions (Signed)
Your procedure is scheduled on: 04/25/19 Report to [facility]. To find out your arrival time please call [phone] between 1PM - 3PM on 04/22/19.  Remember: Instructions that are not followed completely may result in serious medical risk, up to and including death, or upon the discretion of your surgeon and anesthesiologist your surgery may need to be rescheduled.     _X__ 1. Do not eat food after midnight the night before your procedure.                 No gum chewing or hard candies. You may drink clear liquids up to 2 hours                 before you are scheduled to arrive for your surgery- DO not drink clear                 liquids within 2 hours of the start of your surgery.                 Clear Liquids include:  water, apple juice without pulp, clear carbohydrate                 drink such as Clearfast or Gatorade, Black Coffee or Tea (Do not add                 anything to coffee or tea). Diabetics water only  __X__2.  On the morning of surgery brush your teeth with toothpaste and water, you                 may rinse your mouth with mouthwash if you wish.  Do not swallow any              toothpaste of mouthwash.     _X__ 3.  No Alcohol for 24 hours before or after surgery.   _X__ 4.  Do Not Smoke or use e-cigarettes For 24 Hours Prior to Your Surgery.                 Do not use any chewable tobacco products for at least 6 hours prior to                 surgery.  ____  5.  Bring all medications with you on the day of surgery if instructed.   __X__  6.  Notify your doctor if there is any change in your medical condition      (cold, fever, infections).     Do not wear jewelry, make-up, hairpins, clips or nail polish. Do not wear lotions, powders, or perfumes.  Do not shave 48 hours prior to surgery. Men may shave face and neck. Do not bring valuables to the hospital.    [facility] is not responsible for any belongings or  valuables.  Contacts, dentures/partials or body piercings may not be worn into surgery. Bring a case for your contacts, glasses or hearing aids, a denture cup will be supplied. Leave your suitcase in the car. After surgery it may be brought to your room. For patients admitted to the hospital, discharge time is determined by your treatment team.   Patients discharged the day of surgery will not be allowed to drive home.   Please read over the following fact sheets that you were given:   MRSA Information  __X__ Take these medicines the morning of surgery with A SIP OF WATER:  1. ARMOUR THYROID 30 MG tabletARMOUR THYROID 30 MG tablet  2.   3.   4.  5.  6.  ____ Fleet Enema (as directed)   __X__ Use CHG Soap/SAGE wipes as directed  ____ Use inhalers on the day of surgery  ____ Stop metformin/Janumet/Farxiga 2 days prior to surgery    ____ Take 1/2 of usual insulin dose the night before surgery. No insulin the morning          of surgery.   ____ Stop Blood Thinners Coumadin/Plavix/Xarelto/Pleta/Pradaxa/Eliquis/Effient/Aspirin  on   Or contact your Surgeon, Cardiologist or Medical Doctor regarding  ability to stop your blood thinners  __X__ Stop Anti-inflammatories 7 days before surgery such as Advil, Ibuprofen, Motrin,  BC or Goodies Powder, Naprosyn, Naproxen, Aleve, Aspirin    __X__ Stop all herbal supplements, fish oil or vitamin E until after surgery.    ____ Bring C-Pap to the hospital.   G2 GATORADE DRINK TO BE FINISHED 2 HOURS PRIOR TO ARRIVAL  REVIEW INCENTIVE SPIROMETER INSTRUCTIONS

## 2019-04-22 ENCOUNTER — Other Ambulatory Visit
Admission: RE | Admit: 2019-04-22 | Discharge: 2019-04-22 | Disposition: A | Discharge status: Home / Self Care | Payer: BC Managed Care – PPO | Source: Ambulatory Visit | Attending: Orthopedic Surgery | Admitting: Orthopedic Surgery

## 2019-04-22 DIAGNOSIS — Z01812 Encounter for preprocedural laboratory examination: Secondary | ICD-10-CM | POA: Diagnosis not present

## 2019-04-22 LAB — BASIC METABOLIC PANEL
Anion gap: 5 (ref 5–15)
BUN: 13 mg/dL (ref 6–20)
CO2: 29 mmol/L (ref 22–32)
Calcium: 9.5 mg/dL (ref 8.9–10.3)
Chloride: 106 mmol/L (ref 98–111)
Creatinine, Ser: 0.47 mg/dL (ref 0.44–1.00)
GFR calc Af Amer: >60 mL/min (ref >60)
GFR calc non Af Amer: >60 mL/min (ref >60)
Glucose, Bld: 101 mg/dL — ABNORMAL HIGH (ref 70–99)
Potassium: 4.5 mmol/L (ref 3.5–5.1)
Sodium: 140 mmol/L (ref 135–145)

## 2019-04-22 LAB — CBC
HCT: 39.2 % (ref 36.0–46.0)
Hemoglobin: 12.7 g/dL (ref 12.0–15.0)
MCH: 28.6 pg (ref 26.0–34.0)
MCHC: 32.4 g/dL (ref 30.0–36.0)
MCV: 88.3 fL (ref 80.0–100.0)
Platelets: 229 10*3/uL (ref 150–400)
RBC: 4.44 MIL/uL (ref 3.87–5.11)
RDW: 12.6 % (ref 11.5–15.5)
WBC: 4.5 10*3/uL (ref 4.0–10.5)
nRBC: 0 % (ref 0.0–0.2)

## 2019-04-23 LAB — SARS CORONAVIRUS 2 (TAT 6-24 HRS): SARS Coronavirus 2: NEGATIVE

## 2019-04-25 ENCOUNTER — Observation Stay
Admission: RE | Admit: 2019-04-25 | Discharge: 2019-04-26 | Disposition: A | Discharge status: Home / Self Care | Payer: BC Managed Care – PPO | Attending: Orthopedic Surgery | Admitting: Orthopedic Surgery

## 2019-04-25 ENCOUNTER — Encounter
Admission: RE | Disposition: A | Discharge status: Home / Self Care | Payer: Self-pay | Source: Home / Self Care | Attending: Orthopedic Surgery

## 2019-04-25 ENCOUNTER — Ambulatory Visit: Payer: BC Managed Care – PPO | Admitting: Anesthesiology

## 2019-04-25 ENCOUNTER — Other Ambulatory Visit: Payer: Self-pay

## 2019-04-25 ENCOUNTER — Encounter: Payer: Self-pay | Admitting: Orthopedic Surgery

## 2019-04-25 ENCOUNTER — Ambulatory Visit: Payer: BC Managed Care – PPO

## 2019-04-25 DIAGNOSIS — M25852 Other specified joint disorders, left hip: Secondary | ICD-10-CM | POA: Insufficient documentation

## 2019-04-25 DIAGNOSIS — S73192A Other sprain of left hip, initial encounter: Principal | ICD-10-CM | POA: Insufficient documentation

## 2019-04-25 DIAGNOSIS — K219 Gastro-esophageal reflux disease without esophagitis: Secondary | ICD-10-CM | POA: Insufficient documentation

## 2019-04-25 DIAGNOSIS — Q799 Congenital malformation of musculoskeletal system, unspecified: Secondary | ICD-10-CM | POA: Insufficient documentation

## 2019-04-25 DIAGNOSIS — E079 Disorder of thyroid, unspecified: Secondary | ICD-10-CM | POA: Diagnosis not present

## 2019-04-25 DIAGNOSIS — M1990 Unspecified osteoarthritis, unspecified site: Secondary | ICD-10-CM | POA: Insufficient documentation

## 2019-04-25 DIAGNOSIS — E119 Type 2 diabetes mellitus without complications: Secondary | ICD-10-CM | POA: Diagnosis not present

## 2019-04-25 DIAGNOSIS — Z79890 Hormone replacement therapy (postmenopausal): Secondary | ICD-10-CM | POA: Diagnosis not present

## 2019-04-25 DIAGNOSIS — Z7982 Long term (current) use of aspirin: Secondary | ICD-10-CM | POA: Diagnosis not present

## 2019-04-25 DIAGNOSIS — Z419 Encounter for procedure for purposes other than remedying health state, unspecified: Secondary | ICD-10-CM

## 2019-04-25 HISTORY — PX: HIP ARTHROSCOPY: SHX668

## 2019-04-25 SURGERY — ARTHROSCOPY HIP
Anesthesia: General | Site: Hip | Laterality: Left

## 2019-04-25 MED ORDER — ONDANSETRON HCL 4 MG/2ML IJ SOLN
INTRAMUSCULAR | Status: DC | PRN
  Administered 2019-04-25: 4 mg via INTRAVENOUS

## 2019-04-25 MED ORDER — OXYCODONE HCL 5 MG/5ML PO SOLN: 5.0000 mg | Freq: Once | ORAL | Status: DC | PRN

## 2019-04-25 MED ORDER — SCOPOLAMINE 1 MG/3DAYS TD PT72
MEDICATED_PATCH | TRANSDERMAL | Status: AC
  Administered 2019-04-25: 1.5 mg via TRANSDERMAL
  Filled 2019-04-25: qty 1

## 2019-04-25 MED ORDER — SODIUM CHLORIDE 0.9 % IV SOLN: INTRAVENOUS | Status: DC

## 2019-04-25 MED ORDER — PROPOFOL 500 MG/50ML IV EMUL
INTRAVENOUS | Status: AC
  Filled 2019-04-25: qty 50

## 2019-04-25 MED ORDER — ACETAMINOPHEN 500 MG PO TABS
ORAL_TABLET | ORAL | Status: AC
  Filled 2019-04-25: qty 2

## 2019-04-25 MED ORDER — ADULT MULTIVITAMIN W/MINERALS CH
1.0000 | ORAL_TABLET | Freq: Every day | ORAL | Status: DC
  Filled 2019-04-25: qty 1

## 2019-04-25 MED ORDER — ONDANSETRON HCL 4 MG PO TABS: 4.0000 mg | ORAL_TABLET | Freq: Four times a day (QID) | ORAL | Status: DC | PRN

## 2019-04-25 MED ORDER — LACTATED RINGERS IV SOLN: INTRAVENOUS | Status: DC

## 2019-04-25 MED ORDER — FENTANYL CITRATE (PF) 100 MCG/2ML IJ SOLN
INTRAMUSCULAR | Status: AC
  Filled 2019-04-25: qty 2

## 2019-04-25 MED ORDER — EPINEPHRINE PF 1 MG/ML IJ SOLN
INTRAMUSCULAR | Status: AC
  Filled 2019-04-25: qty 4

## 2019-04-25 MED ORDER — PROPOFOL 10 MG/ML IV BOLUS
INTRAVENOUS | Status: DC | PRN
  Administered 2019-04-25: 200 mg via INTRAVENOUS

## 2019-04-25 MED ORDER — ONDANSETRON HCL 4 MG/2ML IJ SOLN
4.0000 mg | Freq: Four times a day (QID) | INTRAMUSCULAR | Status: DC | PRN
  Administered 2019-04-26: 4 mg via INTRAVENOUS
  Filled 2019-04-25: qty 2

## 2019-04-25 MED ORDER — PROMETHAZINE HCL 25 MG/ML IJ SOLN: 6.2500 mg | INTRAMUSCULAR | Status: DC | PRN

## 2019-04-25 MED ORDER — MIDAZOLAM HCL 2 MG/2ML IJ SOLN
INTRAMUSCULAR | Status: DC | PRN
  Administered 2019-04-25: 2 mg via INTRAVENOUS

## 2019-04-25 MED ORDER — ACETAMINOPHEN 10 MG/ML IV SOLN
INTRAVENOUS | Status: AC
  Filled 2019-04-25: qty 100

## 2019-04-25 MED ORDER — OXYCODONE HCL 5 MG PO TABS: 10.0000 mg | ORAL_TABLET | ORAL | Status: DC | PRN

## 2019-04-25 MED ORDER — BUPIVACAINE LIPOSOME 1.3 % IJ SUSP
INTRAMUSCULAR | Status: DC | PRN
  Administered 2019-04-25: 20 mL

## 2019-04-25 MED ORDER — PANTOPRAZOLE SODIUM 40 MG PO TBEC
40.0000 mg | DELAYED_RELEASE_TABLET | Freq: Every day | ORAL | Status: DC
  Administered 2019-04-26: 40 mg via ORAL
  Filled 2019-04-25: qty 1

## 2019-04-25 MED ORDER — EPHEDRINE SULFATE 50 MG/ML IJ SOLN
INTRAMUSCULAR | Status: DC | PRN
  Administered 2019-04-25: 10 mg via INTRAVENOUS
  Administered 2019-04-25: 5 mg via INTRAVENOUS

## 2019-04-25 MED ORDER — CEFAZOLIN SODIUM-DEXTROSE 2-4 GM/100ML-% IV SOLN
2.0000 g | Freq: Four times a day (QID) | INTRAVENOUS | Status: AC
  Administered 2019-04-25 – 2019-04-26 (×3): 2 g via INTRAVENOUS
  Filled 2019-04-25 (×3): qty 100

## 2019-04-25 MED ORDER — BUPIVACAINE HCL (PF) 0.5 % IJ SOLN
INTRAMUSCULAR | Status: AC
  Filled 2019-04-25: qty 30

## 2019-04-25 MED ORDER — METHOCARBAMOL 1000 MG/10ML IJ SOLN
500.0000 mg | Freq: Four times a day (QID) | INTRAVENOUS | Status: DC | PRN
  Administered 2019-04-25: 500 mg via INTRAVENOUS
  Filled 2019-04-25 (×2): qty 5

## 2019-04-25 MED ORDER — SUGAMMADEX SODIUM 500 MG/5ML IV SOLN
INTRAVENOUS | Status: AC
  Filled 2019-04-25: qty 5

## 2019-04-25 MED ORDER — DEXAMETHASONE SODIUM PHOSPHATE 10 MG/ML IJ SOLN
INTRAMUSCULAR | Status: AC
  Filled 2019-04-25: qty 1

## 2019-04-25 MED ORDER — ASPIRIN EC 325 MG PO TBEC
325.0000 mg | DELAYED_RELEASE_TABLET | Freq: Every day | ORAL | Status: DC
  Administered 2019-04-26: 325 mg via ORAL
  Filled 2019-04-25: qty 1

## 2019-04-25 MED ORDER — LACTATED RINGERS IV SOLN
INTRAVENOUS | Status: DC | PRN
  Administered 2019-04-25: 3000 mL

## 2019-04-25 MED ORDER — MEPERIDINE HCL 50 MG/5ML PO SOLN
25.0000 mg | Freq: Once | ORAL | Status: AC
  Administered 2019-04-25: 25 mg via ORAL
  Filled 2019-04-25: qty 2.5

## 2019-04-25 MED ORDER — ACETAMINOPHEN 10 MG/ML IV SOLN
INTRAVENOUS | Status: DC | PRN
  Administered 2019-04-25: 1000 mg via INTRAVENOUS

## 2019-04-25 MED ORDER — ACETAMINOPHEN 500 MG PO TABS
1000.0000 mg | ORAL_TABLET | Freq: Four times a day (QID) | ORAL | Status: DC
  Administered 2019-04-25 – 2019-04-26 (×2): 1000 mg via ORAL
  Filled 2019-04-25 (×3): qty 2

## 2019-04-25 MED ORDER — FAMOTIDINE 20 MG PO TABS
ORAL_TABLET | ORAL | Status: AC
  Filled 2019-04-25: qty 1

## 2019-04-25 MED ORDER — SENNOSIDES-DOCUSATE SODIUM 8.6-50 MG PO TABS: 1.0000 | ORAL_TABLET | Freq: Every evening | ORAL | Status: DC | PRN

## 2019-04-25 MED ORDER — DOCUSATE SODIUM 100 MG PO CAPS
100.0000 mg | ORAL_CAPSULE | Freq: Two times a day (BID) | ORAL | Status: DC
  Administered 2019-04-25 – 2019-04-26 (×2): 100 mg via ORAL
  Filled 2019-04-25 (×2): qty 1

## 2019-04-25 MED ORDER — CEFAZOLIN SODIUM-DEXTROSE 2-4 GM/100ML-% IV SOLN
INTRAVENOUS | Status: AC
  Filled 2019-04-25: qty 100

## 2019-04-25 MED ORDER — PROPOFOL 10 MG/ML IV BOLUS
INTRAVENOUS | Status: AC
  Filled 2019-04-25: qty 20

## 2019-04-25 MED ORDER — LIDOCAINE HCL (CARDIAC) PF 100 MG/5ML IV SOSY
PREFILLED_SYRINGE | INTRAVENOUS | Status: DC | PRN
  Administered 2019-04-25: 80 mg via INTRAVENOUS

## 2019-04-25 MED ORDER — MIDAZOLAM HCL 2 MG/2ML IJ SOLN
INTRAMUSCULAR | Status: AC
  Filled 2019-04-25: qty 2

## 2019-04-25 MED ORDER — SUGAMMADEX SODIUM 200 MG/2ML IV SOLN
INTRAVENOUS | Status: DC | PRN
  Administered 2019-04-25: 250 mg via INTRAVENOUS

## 2019-04-25 MED ORDER — LACTATED RINGERS IV SOLN: INTRAVENOUS | Status: DC | PRN

## 2019-04-25 MED ORDER — METHOCARBAMOL 500 MG PO TABS
500.0000 mg | ORAL_TABLET | Freq: Four times a day (QID) | ORAL | Status: DC | PRN
  Administered 2019-04-25 – 2019-04-26 (×2): 500 mg via ORAL
  Filled 2019-04-25 (×2): qty 1

## 2019-04-25 MED ORDER — BUPIVACAINE HCL (PF) 0.5 % IJ SOLN
INTRAMUSCULAR | Status: DC | PRN
  Administered 2019-04-25: 30 mL

## 2019-04-25 MED ORDER — ACETAMINOPHEN 500 MG PO TABS
1000.0000 mg | ORAL_TABLET | Freq: Once | ORAL | Status: AC
  Administered 2019-04-25: 1000 mg via ORAL

## 2019-04-25 MED ORDER — SCOPOLAMINE 1 MG/3DAYS TD PT72: 1.0000 | MEDICATED_PATCH | TRANSDERMAL | Status: DC

## 2019-04-25 MED ORDER — MEPERIDINE HCL 50 MG/ML IJ SOLN
INTRAMUSCULAR | Status: AC
  Administered 2019-04-25: 50 mg
  Filled 2019-04-25: qty 1

## 2019-04-25 MED ORDER — FENTANYL CITRATE (PF) 100 MCG/2ML IJ SOLN: 25.0000 ug | INTRAMUSCULAR | Status: DC | PRN

## 2019-04-25 MED ORDER — CEFAZOLIN SODIUM 1 G IJ SOLR
INTRAMUSCULAR | Status: AC
  Filled 2019-04-25: qty 10

## 2019-04-25 MED ORDER — OXYCODONE HCL 5 MG PO TABS
5.0000 mg | ORAL_TABLET | ORAL | Status: DC | PRN
  Administered 2019-04-25 – 2019-04-26 (×3): 10 mg via ORAL
  Filled 2019-04-25 (×3): qty 2

## 2019-04-25 MED ORDER — THYROID 30 MG PO TABS
30.0000 mg | ORAL_TABLET | Freq: Every day | ORAL | Status: DC
  Filled 2019-04-25 (×2): qty 1

## 2019-04-25 MED ORDER — DEXAMETHASONE SODIUM PHOSPHATE 10 MG/ML IJ SOLN
INTRAMUSCULAR | Status: DC | PRN
  Administered 2019-04-25: 4 mg via INTRAVENOUS

## 2019-04-25 MED ORDER — BUPIVACAINE LIPOSOME 1.3 % IJ SUSP
INTRAMUSCULAR | Status: AC
  Filled 2019-04-25: qty 20

## 2019-04-25 MED ORDER — VECURONIUM BROMIDE 10 MG IV SOLR
INTRAVENOUS | Status: AC
  Filled 2019-04-25: qty 10

## 2019-04-25 MED ORDER — FENTANYL CITRATE (PF) 250 MCG/5ML IJ SOLN
INTRAMUSCULAR | Status: AC
  Filled 2019-04-25: qty 5

## 2019-04-25 MED ORDER — DIPHENHYDRAMINE HCL 12.5 MG/5ML PO ELIX: 12.5000 mg | ORAL_SOLUTION | ORAL | Status: DC | PRN

## 2019-04-25 MED ORDER — FENTANYL CITRATE (PF) 100 MCG/2ML IJ SOLN
INTRAMUSCULAR | Status: DC | PRN
  Administered 2019-04-25: 25 ug via INTRAVENOUS
  Administered 2019-04-25 (×6): 50 ug via INTRAVENOUS
  Administered 2019-04-25: 25 ug via INTRAVENOUS
  Administered 2019-04-25 (×2): 50 ug via INTRAVENOUS

## 2019-04-25 MED ORDER — GABAPENTIN 300 MG PO CAPS
300.0000 mg | ORAL_CAPSULE | Freq: Once | ORAL | Status: AC
  Administered 2019-04-25: 300 mg via ORAL

## 2019-04-25 MED ORDER — VECURONIUM BROMIDE 10 MG IV SOLR
INTRAVENOUS | Status: DC | PRN
  Administered 2019-04-25: 3 mg via INTRAVENOUS
  Administered 2019-04-25 (×2): 1 mg via INTRAVENOUS
  Administered 2019-04-25: 2 mg via INTRAVENOUS
  Administered 2019-04-25: 7 mg via INTRAVENOUS
  Administered 2019-04-25: 2 mg via INTRAVENOUS
  Administered 2019-04-25: 4 mg via INTRAVENOUS

## 2019-04-25 MED ORDER — ONDANSETRON HCL 4 MG/2ML IJ SOLN
INTRAMUSCULAR | Status: AC
  Filled 2019-04-25: qty 2

## 2019-04-25 MED ORDER — OXYCODONE HCL 5 MG PO TABS: 5.0000 mg | ORAL_TABLET | Freq: Once | ORAL | Status: DC | PRN

## 2019-04-25 MED ORDER — CEFAZOLIN SODIUM-DEXTROSE 2-4 GM/100ML-% IV SOLN
2.0000 g | INTRAVENOUS | Status: AC
  Administered 2019-04-25 (×2): 2 g via INTRAVENOUS

## 2019-04-25 MED ORDER — SODIUM CHLORIDE FLUSH 0.9 % IV SOLN
INTRAVENOUS | Status: AC
  Filled 2019-04-25: qty 10

## 2019-04-25 MED ORDER — PROPOFOL 500 MG/50ML IV EMUL
INTRAVENOUS | Status: DC | PRN
  Administered 2019-04-25: 100 ug/kg/min via INTRAVENOUS

## 2019-04-25 MED ORDER — SUCCINYLCHOLINE CHLORIDE 200 MG/10ML IV SOSY
PREFILLED_SYRINGE | INTRAVENOUS | Status: AC
  Filled 2019-04-25: qty 10

## 2019-04-25 MED ORDER — CHLORHEXIDINE GLUCONATE 4 % EX LIQD
60.0000 mL | Freq: Once | CUTANEOUS | Status: AC
  Administered 2019-04-25: 4 via TOPICAL

## 2019-04-25 MED ORDER — FAMOTIDINE 20 MG PO TABS
20.0000 mg | ORAL_TABLET | Freq: Once | ORAL | Status: AC
  Administered 2019-04-25: 20 mg via ORAL

## 2019-04-25 MED ORDER — HYDROMORPHONE HCL 1 MG/ML IJ SOLN: 0.2000 mg | INTRAMUSCULAR | Status: DC | PRN

## 2019-04-25 MED ORDER — GABAPENTIN 300 MG PO CAPS
ORAL_CAPSULE | ORAL | Status: AC
  Filled 2019-04-25: qty 1

## 2019-04-25 MED ORDER — LIDOCAINE HCL (PF) 2 % IJ SOLN
INTRAMUSCULAR | Status: AC
  Filled 2019-04-25: qty 10

## 2019-04-25 SURGICAL SUPPLY — 64 items
ADAPTER IRRIG TUBE 2 SPIKE SOL (ADAPTER) ×4 IMPLANT
ADPR TBG 2 SPK PMP STRL ASCP (ADAPTER) ×2
ANCH SUT 2.4 KNTLS INSRTR LCK (Anchor) ×2 IMPLANT
ANCHOR SUT 2.4 SS KNTLS #1 (Anchor) ×4 IMPLANT
APL PRP STRL LF DISP 70% ISPRP (MISCELLANEOUS) ×1
BIT DRILL SS CINCHLOCK (BIT) ×2 IMPLANT
BLADE SAMURAI STR FULL RADIUS (BLADE) ×2 IMPLANT
BLADE SURG SZ11 CARB STEEL (BLADE) ×2 IMPLANT
BNDG ADH 2 X3.75 FABRIC TAN LF (GAUZE/BANDAGES/DRESSINGS) ×10 IMPLANT
BNDG ADH XL 3.75X2 STRCH LF (GAUZE/BANDAGES/DRESSINGS) ×5
BUR 4.0 ROUND XL DIAMOND (BUR) ×2 IMPLANT
BUR 5.5 ROUND LONG FS 8 FLUTE (BUR) ×2 IMPLANT
BUR 5.5 ROUND XL DIAMOND (BUR) ×2 IMPLANT
CANNULA 8 456 TRANSPORT (CANNULA) ×2 IMPLANT
CANNULA 8 789 TRANSPORT (CANNULA) ×2 IMPLANT
CANNULA OBTURATOR FLOWPORT (CANNULA) IMPLANT
CHLORAPREP W/TINT 26 (MISCELLANEOUS) ×2 IMPLANT
COVER WAND RF STERILE (DRAPES) ×2 IMPLANT
CRADLE LAMINECT ARM (MISCELLANEOUS) ×2 IMPLANT
CUTTER AGGRESSIVE PLUS 4D 180L (CUTTER) ×2 IMPLANT
DEVICE SUCT BLK HOLE OR FLOOR (MISCELLANEOUS) ×2 IMPLANT
DRAPE 3/4 80X56 (DRAPES) IMPLANT
DRAPE C-ARM 42X72 X-RAY (DRAPES) ×2 IMPLANT
DRAPE SURG 17X11 SM STRL (DRAPES) ×2 IMPLANT
GAUZE SPONGE 4X4 12PLY STRL (GAUZE/BANDAGES/DRESSINGS) ×2 IMPLANT
GLOVE BIOGEL PI IND STRL 8 (GLOVE) ×1 IMPLANT
GLOVE BIOGEL PI INDICATOR 8 (GLOVE) ×1
GLOVE SURG ORTHO 8.0 STRL STRW (GLOVE) ×4 IMPLANT
GOWN STRL REUS W/ TWL LRG LVL3 (GOWN DISPOSABLE) ×1 IMPLANT
GOWN STRL REUS W/ TWL XL LVL3 (GOWN DISPOSABLE) ×1 IMPLANT
GOWN STRL REUS W/TWL LRG LVL3 (GOWN DISPOSABLE) ×2
GOWN STRL REUS W/TWL XL LVL3 (GOWN DISPOSABLE) ×2
IV LACTATED RINGER IRRG 3000ML (IV SOLUTION) ×90
IV LR IRRIG 3000ML ARTHROMATIC (IV SOLUTION) ×45 IMPLANT
KIT PATIENT POSITION MEDIUM (KITS) ×2 IMPLANT
KIT PORTAL ENTRY HIP ACCESS (KITS) ×2 IMPLANT
KIT TURNOVER CYSTO (KITS) ×2 IMPLANT
MANIFOLD NEPTUNE II (INSTRUMENTS) ×2 IMPLANT
MAT ABSORB  FLUID 56X50 GRAY (MISCELLANEOUS) ×2
MAT ABSORB FLUID 56X50 GRAY (MISCELLANEOUS) ×1 IMPLANT
NDL SAFETY ECLIPSE 18X1.5 (NEEDLE) ×1 IMPLANT
NEEDLE HYPO 18GX1.5 SHARP (NEEDLE) ×2
NEEDLE INJECTOR II CARTRIDGE (MISCELLANEOUS) ×2 IMPLANT
PACK ARTHROSCOPY KNEE (MISCELLANEOUS) ×4 IMPLANT
PACK UNIVERSAL (MISCELLANEOUS) ×2 IMPLANT
PAD ABD DERMACEA PRESS 5X9 (GAUZE/BANDAGES/DRESSINGS) ×2 IMPLANT
PAD PREP 24X41 OB/GYN DISP (PERSONAL CARE ITEMS) ×2 IMPLANT
PASSER SUT 1.5D CRESCENT (INSTRUMENTS) ×4 IMPLANT
PASSER SUT 70D UP ANGLED (INSTRUMENTS) ×2 IMPLANT
SERFAS 50-S SWEEP XL (INSTRUMENTS) ×2
SET TUBE SUCT SHAVER OUTFL 24K (TUBING) ×2 IMPLANT
SUT ETHILON 3-0 FS-10 30 BLK (SUTURE) ×2
SUT FORCE FIBER 2 38IN K BLUE (SUTURE) ×2
SUT VIC AB 2-0 CT2 27 (SUTURE) ×2 IMPLANT
SUT ZIPLINE SZ2 BLK (SUTURE) ×6 IMPLANT
SUT ZIPLINE SZ2 GREEN (SUTURE) ×12 IMPLANT
SUTURE EHLN 3-0 FS-10 30 BLK (SUTURE) ×1 IMPLANT
SUTURE FORCE FIBER 2 38IN K BL (SUTURE) ×1 IMPLANT
SUTURE TAPE XBRAID 1.2 BLUE 45 (SUTURE) ×4 IMPLANT
SUTURETAPE XBRAID 1.2 BLUE 45 (SUTURE) ×8
TRAY FOLEY SLVR 16FR LF STAT (SET/KITS/TRAYS/PACK) ×2 IMPLANT
TUBING ARTHRO INFLOW-ONLY STRL (TUBING) ×2 IMPLANT
WAND SERFAS 50-S SWEEP XL (INSTRUMENTS) ×1 IMPLANT
hip check sterile drape ×2 IMPLANT

## 2019-04-25 NOTE — Discharge Instructions (Signed)
Hip Arthroscopy Post-Operative Instructions  1. Physical Therapy should start within 3-4 days of surgery. If your therapist has ANY questions, please ask them to call our offices. 2. If oozing from surgery site occurs, and the dressing appears soaked with bloody fluid please change the dressing as needed. This normally occurs after fluid irrigation during surgery, and will resolve within 24-36 hours. 3. Icing is very important for the first 5-7 days postoperative, and ice is applied (ice packs or ice therapy) as often as possible or at least for 20-minute periods 3-4 times per day. Ice should not be applied directly on the skin. 4. Physical therapist will remove dressing at 1st PT visit. 5. Apply Band-Aids to wound sites and change them once a day. Keep the wound clean and dry. 6. Please do not use bacitracin or other ointments under the bandage. 7. Showering is allowed on post-op day #4 if the wound is dry. MAKE SURE EACH INCISION IS COVERED WITH A WATERPROOF BANDAID DURING SHOWER ONLY! 8. Do not soak the hip in water in a bathtub or pool until the sutures are removed. Typically getting into a bath or pool is permitted 4 weeks after surgery.  9. Driving is permitted after 1 week for L hip surgery only if the narcotic pain medication is no longer being taken and you feel comfortable getting into and out of a car. For R hip surgery, driving is permitted after 2 weeks. Driving a manual car may take up to 3-4 weeks. 10. Please ensure you have a follow-up appointment for suture removal ~2 weeks after surgery.  11. The anesthetic drugs used during your surgery may cause nausea for the first 24 hours. If nausea is encountered, drink only clear liquids (i.e. Sprite or 7-up). The only solids should be dry crackers or toast. If nausea and vomiting become severe or the patient shows sign of dehydration (lack of urination) please call the doctor or the surgicenter. 12. If you develop a fever (101.5), redness, or  yellow/brown/green drainage from the surgical incision site, please call our office to arrange for an evaluation.  13: POST-OPERATIVE PRESCRIPTIONS:  HETERTOPIC BONE PROPHYLAXIS FOR 30 DAYS: 1. EC-Naprosyn 500mg , 1 tablet by mouth two times per day x 30 days - Can skip if unable to take NSAIDs 2. Prilosec (Stomach Prophylaxis) 20mg , 1 tablet by mouth daily (take on an empty stomach x 30 days  DVT PROPHYLAXIS 3. Aspirin 325mg  by mouth daily x 2 weeks  PAIN MEDICATION:  4. Oxycodone 1 to 3 tablets by mouth every 4 hours as needed 5. Tylenol 1000mg  three times day for at least 5 days, then as needed to reduce narcotics  ANTI-NAUSEA (if applicable):  6. Zofran 4mg  tablet, 1 tablet every 6 hours as needed. You will be given a prescription, but it is optional to fill it.  ANTI-SPASM (if applicable):  7. Zanaflex 4mg , 2 tablets by mouth every 6 hours as needed.  ANTI-CONSTIPATION 8. Colace 100mg , 2 tablets by mouth daily (to prevent constipation with use of narcotic medications)  14. You will take as aspirin (325 mg) daily x 2 weeks. This may lower the risk of a blood clot developing after surgery. Should severe calf pain occur or significant swelling of calf and ankle, please call our offices. 15. Local anesthetics (i.e. Novocaine) are put into the incision after surgery. It is not uncommon for patients to encounter more pain on the first or second day after surgery. This is the time when swelling peaks. Taking pain  medication before bedtime will assist in sleeping. It is important not to drink or drive while taking narcotic medication. You should resume your normal medications for other conditions the day after surgery. 16. Follow weight bearing instructions as advised at discharge. Crutches may be necessary to assist walking. Extremity elevation for the first 72 hours is also encouraged to minimize swelling. 17. If unexpected problems occur and you need to speak to the doctor, call the  office.   Important Contact Information Julio Sicks Musician): [phone] Fax Number: [phone]

## 2019-04-25 NOTE — H&P (Signed)
Paper H&P to be scanned into permanent record. H&P reviewed. No significant changes noted.  

## 2019-04-25 NOTE — OR Nursing (Signed)
Per Dr. Orland Penman, pt is okay to receive pepcid prior to procedure . See [facility]

## 2019-04-25 NOTE — Anesthesia Procedure Notes (Signed)
Procedure Name: Intubation Performed by: Tonia Ghent Pre-anesthesia Checklist: Patient identified, Emergency Drugs available, Suction available, Patient being monitored and Timeout performed Patient Re-evaluated:Patient Re-evaluated prior to induction Oxygen Delivery Method: Circle system utilized Preoxygenation: Pre-oxygenation with 100% oxygen Induction Type: IV induction Ventilation: Mask ventilation without difficulty Laryngoscope Size: Miller and 3 Grade View: Grade II Tube type: Oral Tube size: 6.5 mm Number of attempts: 1 Airway Equipment and Method: Stylet Placement Confirmation: ETT inserted through vocal cords under direct vision,  positive ETCO2,  CO2 detector and breath sounds checked- equal and bilateral Secured at: 19 cm Tube secured with: Tape Dental Injury: Teeth and Oropharynx as per pre-operative assessment

## 2019-04-25 NOTE — Transfer of Care (Signed)
Immediate Anesthesia Transfer of Care Note  Patient: Susan Stokes  Procedure(s) Performed: LEFT HIP ARTHROSCOPY, ACETABULOPLASTY, LABRAL REPAIR, FEMORAL OSTEOCHONDROPLASTY, CAPSULAR PLICATION (Left Hip)  Patient Location: PACU  Anesthesia Type:General  Level of Consciousness: awake and sedated  Airway & Oxygen Therapy: Patient Spontanous Breathing and Patient connected to face mask oxygen  Post-op Assessment: Report given to RN and Post -op Vital signs reviewed and stable  Post vital signs: Reviewed and stable  Last Vitals:  Vitals Value Taken Time  BP 108/59 04/25/19 1401  Temp 36.2 C 04/25/19 1409  Pulse 74 04/25/19 1411  Resp 16 04/25/19 1411  SpO2 100 % 04/25/19 1411  Vitals shown include unvalidated device data.  Last Pain:  Vitals:   04/25/19 1401  TempSrc:   PainSc: Asleep         Complications: No apparent anesthesia complications

## 2019-04-25 NOTE — Anesthesia Preprocedure Evaluation (Addendum)
Anesthesia Evaluation  Patient identified by MRN, date of birth, ID band Patient awake    Reviewed: Allergy & Precautions, H&P , NPO status , Patient's Chart, lab work & pertinent test results  Airway Mallampati: II  TM Distance: >3 FB Neck ROM: full    Dental  (+) Teeth Intact   Pulmonary neg pulmonary ROS, neg COPD,    breath sounds clear to auscultation       Cardiovascular (-) hypertensionnegative cardio ROS   Rhythm:regular Rate:Normal     Neuro/Psych  Headaches, PSYCHIATRIC DISORDERS Depression    GI/Hepatic Neg liver ROS, GERD  ,H/o gastric sleeve   Endo/Other  diabetesHypothyroidism   Renal/GU      Musculoskeletal   Abdominal   Peds  Hematology negative hematology ROS (+)   Anesthesia Other Findings Past Medical History: No date: Allergy No date: Depression No date: Diabetes mellitus without complication (HCC)     Comment:  gastric sleeve No date: Diverticulitis No date: Frequent headaches No date: GERD (gastroesophageal reflux disease) No date: IBS (irritable bowel syndrome) No date: Thyroid disease  Past Surgical History: 2004: ABDOMINAL HYSTERECTOMY 2006: APPENDECTOMY 2007: BREAST REDUCTION SURGERY 2014: CHOLECYSTECTOMY No date: LAPAROSCOPIC GASTRIC SLEEVE RESECTION No date: SHOULDER ARTHROSCOPY W/ ROTATOR CUFF REPAIR; Right     Reproductive/Obstetrics negative OB ROS                            Anesthesia Physical Anesthesia Plan  ASA: II  Anesthesia Plan: General ETT   Post-op Pain Management:    Induction:   PONV Risk Score and Plan: Ondansetron, Dexamethasone, Midazolam, Treatment may vary due to age or medical condition, Scopolamine patch - Pre-op and Propofol infusion  Airway Management Planned:   Additional Equipment:   Intra-op Plan:   Post-operative Plan:   Informed Consent: I have reviewed the patients History and Physical, chart, labs and  discussed the procedure including the risks, benefits and alternatives for the proposed anesthesia with the patient or authorized representative who has indicated his/her understanding and acceptance.     Dental Advisory Given  Plan Discussed with: Anesthesiologist  Anesthesia Plan Comments:        Anesthesia Quick Evaluation

## 2019-04-25 NOTE — Op Note (Addendum)
Operative Note    SURGERY DATE: 04/25/2019   PRE-OP DIAGNOSIS:  1. Left femoroacetabular impingement 2. Left hip labral tear 3. 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 Left femoroacetabular impingement 2. Left hip labral tear 3. 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 Left hip arthroscopy with acetabuloplasty, labral repair, femoral osteochondroplasty, and capsular plication   SURGEON: Cato Mulligan, MD  ASSISTANT: Reche Dixon, PA   ANESTHESIA: Gen   ESTIMATED BLOOD LOSS: minimal   TOTAL IV FLUIDS: per anesthesia  INDICATION(S): The patient is a 49 y.o. year old female who presents with persistent hip pain.  Radiographs demonstrated borderline hip dysplasia and FAI morphology with a small cam lesion.  MRI revealed a labral tear.  She has failed greater than 3 months of non-operative treatment to date including activity modifications, physical therapy, and corticosteroid injection.  She did note that a prior corticosteroid injection gave her approximately 90% symptomatic pain relief.  Please see the preoperative notes for further detail.   We did discuss that with her borderline dysplasia and signs of microinstability, she could potentially be a candidate for periacetabular osteotomy.  However, She elected to undergo the above mentioned procedure after detailed explanation of the expected outcomes and recovery path.  She understands that she may still require further surgery in the future  Informed consent was obtained outlining the expected benefits and possible risks of the surgery including a less than 5% chance of numbness in the sciatic or pudendal nerve regions, 20% chance of injury to the lateral femoral cutaneous nerve (1% permanent injury). Other risks include continued pain due to preexisting chondromalacia and other general risks of surgery such as blood clots, infection and bleeding.  OPERATIVE  FINDINGS: Cartilage No significant degenerative changes of the acetabular cartilage other than mild delamination along the regions of the labral tear defined below  High grade cartilage lesion: no Bone exposed: no Bruising: no Localization of femoral head high grade lesion: none, there was mild diffuse grade 1 degenerative changes of the femoral head Cotyloid fossa osteophytes:  none The remainder of the femoral and acetabular cartilage was normal.  Labrum Labral degeneration, yellow over 50% of labrum: no Complexity of tearing:  Tear at chondrolabral junction with positive wave sign Hypoplastic labrum: no Hyperplastic labrum: Mildly hypoplastic Lipstick sign at the psoas prominence: none Psoas Prominence: no  Boundaries of labral tear Convention (3 o'clock anterior, 9 o'clock posterior) Anterior boundary: 1 o'clock Posterior boundary: 3 o'clock  Ligamentum teres Hypertrophy:  no Tear: Partial-thickness  OPERATIVE REPORT:  The patient was brought to the operating room, placed supine on the operating table, and bony prominences were padded.  The traction boots were applied with padding to ensure that safe traction could be applied through the feet.  The contralateral limb was abducted slightly and light traction was applied.  The operative leg was brought into neutral position.  Appropriate preoperative IV antibiotics were administered. The patient was prepped and draped in a sterile fashion.  Time-out was performed and landmarks were identified.  Air arthrogram was not needed as straight traction broke the labral seal allowing for distraction of the hip. Care was taken to ensure the least amount of force necessary to allow safe access to the joint of 8-57mm.  This was checked with fluoroscopy.   Next we placed an anterolateral portal under the assistance of fluoroscopy.  First, fluoroscopy was used to estimate the trajectory and starting point.  A 43mm incision with a #11 blade was made  and a straight hemostat was used to dilate the portal through the appropriate tract.  We then placed a 14-gauge hypodermic needle with careful technique to be as close to the femoral head as possible and parallel to the sourcil to ensure no iatrogenic damage to the labrum.  This released the negative pressure environment and the amount of traction was adjusted to maintain the 8-75mm of distraction.  A nitinol wire was placed through the needle and flouroscopy was used to ensure it extended to the medial wall of the acetabulum.  The Flowport from [employer] was placed over the wire and the nitinol wire was retracted to just inside the capsule during insertion of the dilator and cannula to minimize the risk of breakage. The arthroscope was placed next and we visualized the anterior triangle.  We then placed the anterior portal under direct visualization using the technique described above.  This was safely placed as well without damage to the labrum or femoral head.  We then switched our arthroscope to the anterior portal to ensure we were not through the labrum - we were safely through the capsule only.  We then proceeded with a transverse capsulotomy connecting the 2 portals in the same plane utilizing the Samurai blade from [employer].  The Injector device from [employer] was used to place traction stitches each in the medial and lateral arms of the proximal capsule.  A Kelly clamp was used to hold the suture against the skin to apply traction. This allowed access to the acetabular rim and labrum as well as protection of the native edges of the capsule.  We identified the anterior inferior iliac spine proximally, the psoas tendon medially and the rectus tendon laterally as landmarks.  We then proceeded with a diagnostic arthroscopy - the results can be found in the findings section above.  We then used the 50 degree hip specific radiofrequency device and a 20mm shaver to clear the superior  acetabulum, identify the chondral labral junction, and allow room for placement of labral repair anchors.  A 4.49mm diamond burr was used to very lightly smooth and the acetabular rim to allow for appropriate anchor placement.  Care was taken not to perform any significant resection given the patient's underlying borderline dysplasia.  Next, labral repair was performed.  A distal anterolateral portal was placed under direct visualization and the Transport cannula was inserted.  Care was taken to ensure the cannula was in the intermuscular plane between the gluteus minimus and iliocapsularis.  This portal was approximately 4cm distal and 1cm anterior to the anterolateral portal.      We placed 2 anchors at the 1:30 and 2:30 positions with a vertical mattress stitch at each of the above positions. The sutures were passed using the crescent Nanopass from [employer].  This resulted in anatomic labral repair.  We debrided the loose cartilage at the rim and residual degenerative labral tissue.  Excellent suction seal was noted.  Traction was let down with total traction time of 119 minutes.    We then turned our attention to the peripheral compartment.  We flexed the hip to 45 degrees. We viewed from the anterior portal and worked from the distal anterolateral portal.  First we passed two traction stitches in the medial and lateral sides of the capsulotomy.  In between these sutures, we performed a T capsulotomy with the samurai blade from [employer] down to the intertrochanteric  line in the plane between the iliocapsularis and gluteus minimus. The position of the T-capsulotomy was checked with fluoroscopy to ensure a safe position along the anterolateral neck.  Next we placed one additional traction stitch in the lateral limb of the T-capsulotomy.  Clamps were used to hold these stitches against the skin for retraction.  Adequate mobilization and retraction was achieved such that we could view the entire CAM  lesion. The capsule was retracted with a switching stick through the AL portal when necessary.  We were able to view the medial and lateral synovial folds, identifying the medial and lateral circumflex arteries.  These were protected during the osteoplasty.  The previously used diamond burr was used to reshape the femoral neck.  The initial line of resection was defined with the use of dynamic exam and fluoroscopy.  The line was parallel to the acetabular rim with the leg in neutral rotation.  We viewed the base of the femoral neck to provide a foundation for the shape and size of the osteochondroplasty. We were able to adequately remove the cam lesion and reshape the femoral neck.  This was confirmed with fluoroscopy.  Dynamic exam showed no residual impingement flexed to 90 degrees with maximal internal rotation and external rotation.  Finally, we performed a complete capsular plication with Zipline suture from [employer], utilizing the Slingshot from [employer] to pass the suture.  A simple stitch was placed in the vertical portion of the T capsulotomy and this was tied arthroscopically.  Next, 4 stitches were placed along the length of the intraportal capsulotomy.  These were placed in such a fashion to allow for an inferior capsular shift by placing the proximal sutures more lateral in the distal sutures more medial while taking larger bites of the distal capsule.  These were then tied sequentially with alternating half hitches through an 8.30mm Transport cannula.  Appropriate reapproximation and tightening of the capsule was confirmed.   We then removed the arthroscope and closed the incisions with 2-0 Vicryl subdermally and 3-0 nylon stitches.  Local anesthetic was injected about the portals and tracts down to the joint. A sterile dressing was applied and hip brace was applied.  The patient was awakened from anesthesia and transferred to PACU in stable condition.  Of note, assistance from a PA was  essential to performing the surgery. PA assisted with patient positioning, retraction, and instrumentation. The surgery would have been more difficult and had longer operative time without PA assistance.    POSTOPERATIVE PLAN: The patient will be admitted overnight for observation.  Flatfoot weightbearing with hip abduction brace x3 weeks.  Avoid extension and external rotation beyond neutral x4 weeks.  PT/OT in a.m.  Perioperative antibiotics x24 hours.  Patient will follow-up as an outpatient in 2 weeks.  Outpatient physical therapy to start in 3-4 days.

## 2019-04-25 NOTE — OR Nursing (Signed)
This RN spoke with Dr. Allena Katz over the phone, verbal orders to give pt 1g of tylenol and 300mg  of gabapentin prior to procedure. See MAR.

## 2019-04-26 DIAGNOSIS — S73192A Other sprain of left hip, initial encounter: Secondary | ICD-10-CM | POA: Diagnosis not present

## 2019-04-26 LAB — HIV ANTIBODY (ROUTINE TESTING W REFLEX): HIV Screen 4th Generation wRfx: NONREACTIVE

## 2019-04-26 MED ORDER — METHOCARBAMOL 500 MG PO TABS: 500.0000 mg | ORAL_TABLET | Freq: Four times a day (QID) | ORAL | 0 refills | Status: DC | PRN

## 2019-04-26 MED ORDER — OXYCODONE HCL 5 MG PO TABS: 5.0000 mg | ORAL_TABLET | ORAL | 0 refills | Status: DC | PRN

## 2019-04-26 MED ORDER — PANTOPRAZOLE SODIUM 40 MG PO TBEC: 40.0000 mg | DELAYED_RELEASE_TABLET | Freq: Every day | ORAL | 0 refills | Status: DC

## 2019-04-26 MED ORDER — TRAMADOL HCL 50 MG PO TABS
50.0000 mg | ORAL_TABLET | Freq: Four times a day (QID) | ORAL | Status: DC
  Administered 2019-04-26: 50 mg via ORAL
  Filled 2019-04-26: qty 1

## 2019-04-26 MED ORDER — ONDANSETRON HCL 4 MG PO TABS: 4.0000 mg | ORAL_TABLET | Freq: Four times a day (QID) | ORAL | 0 refills | Status: DC | PRN

## 2019-04-26 MED ORDER — ASPIRIN 325 MG PO TBEC: 325.0000 mg | DELAYED_RELEASE_TABLET | Freq: Every day | ORAL | 0 refills | Status: DC

## 2019-04-26 MED ORDER — TRAMADOL HCL 50 MG PO TABS: 50.0000 mg | ORAL_TABLET | Freq: Four times a day (QID) | ORAL | 0 refills | Status: DC | PRN

## 2019-04-26 NOTE — TOC Transition Note (Signed)
Transition of Care [facility]) - CM/SW Discharge Note   Patient Details  Name: Susan Stokes MRN: [id number] Date of Birth: 08-26-1970  [facility] of [facility]) CM/SW Contact:  Elliot Gault, LCSW Phone Number: 04/26/2019, 11:12 AM   Clinical Narrative:     Pt stable for dc per MD. Pt will return home. She needs RW and BSC. Have referred to [facility]. There are no other TOC needs for dc.  Final next level of care: Home/Self Care Barriers to Discharge: Barriers Resolved   Patient Goals and CMS Choice Patient states their goals for this hospitalization and ongoing recovery are:: go home CMS Medicare.gov Compare Post Acute Care list provided to:: Patient Choice offered to / list presented to : Patient  Discharge Placement                       Discharge Plan and Services                DME Arranged: Walker rolling, 3-N-1   Date DME Agency Contacted: 04/26/19   Representative spoke with at DME Agency: Nida Boatman            Social Determinants of Health (SDOH) Interventions     Readmission Risk Interventions No flowsheet data found.

## 2019-04-26 NOTE — Evaluation (Signed)
Physical Therapy Evaluation Patient Details Name: Susan Stokes MRN: [id number] DOB: Dec 25, 1970 Today's Date: 04/26/2019   History of Present Illness  Susan Stokes is a 49yo female pt POD1 from L hip arthroscopy, acetabuloplasty, labral repair, demoral osteochondroplasty, and capsular plication. PMHx includes DM, depression, gastric sleeve, IBS, and hx of R RTC repair.  Clinical Impression  Pt admitted with above diagnosis. Pt currently with functional limitations due to the deficits listed below (see "PT Problem List"). Upon entry, pt in recliner, awake and agreeable to participate, husband at bedside. Pt educated on use of brace and postoperative precautions. The pt is alert and oriented x4, pleasant, conversational, and generally a good historian. Pt able to AMB 71f x2, then perform 4 stairs up/down with RW, husband assisting for safety. Pt still has some bloody drainage from wound, RN already aware. Pt/husband attest to confidence in being able to safely access home at DC today, good confidence in safe mobility within the home.      Follow Up Recommendations Outpatient PT    Equipment Recommendations  Rolling walker with 5" wheels    Recommendations for Other Services       Precautions / Restrictions Precautions Precautions: Other (comment) Precaution Comments: avoid external hip rptation and hip extension for 4 weeks; hip abduction brace and LLE FFWB for 3 weeks Restrictions Weight Bearing Restrictions: Yes LLE Weight Bearing: Partial weight bearing("FFWB") LLE Partial Weight Bearing Percentage or Pounds: 15-20% - flat foot weight bearing to support balance, avoid hip extension in gait      Mobility  Bed Mobility Overal bed mobility: Needs Assistance Bed Mobility: Supine to Sit     Supine to sit: Min assist;HOB elevated     General bed mobility comments: in chair upon entry  Transfers Overall transfer level: Needs assistance Equipment used: Rolling walker (2  wheeled) Transfers: Sit to/from Stand Sit to Stand: Supervision         General transfer comment: VC for technique and hand/foot placement, steady/cautious throughout  Ambulation/Gait Ambulation/Gait assistance: Supervision Gait Distance (Feet): 40 Feet(414fx2) Assistive device: Rolling walker (2 wheeled) Gait Pattern/deviations: Step-to pattern     General Gait Details: uses LLE FFWB/TDWB as educated; moveing very slowly  Stairs Stairs: Yes Stairs assistance: Min guard Stair Management: No rails;Backwards;With walker Number of Stairs: 4 General stair comments: step-to gait  Wheelchair Mobility    Modified Rankin (Stroke Patients Only)       Balance Overall balance assessment: Needs assistance Sitting-balance support: Bilateral upper extremity supported;Feet supported Sitting balance-Leahy Scale: Fair     Standing balance support: Bilateral upper extremity supported Standing balance-Leahy Scale: Fair Standing balance comment: moderate reliance on RW initially, able to take both hands off during toileting hygiene without LOB                             Pertinent Vitals/Pain Pain Assessment: No/denies pain Pain Score: 5  Pain Location: 4-5 throughout session and ADL mobility Pain Descriptors / Indicators: Aching;Discomfort;Grimacing;Guarding Pain Intervention(s): Limited activity within patient's tolerance;Monitored during session;Premedicated before session;Repositioned;Ice applied    Home Living Family/patient expects to be discharged to:: Private residence Living Arrangements: Spouse/significant other Available Help at Discharge: Family;Available 24 hours/day Type of Home: House Home Access: Stairs to enter   [employer] Steps: 3 from garage, no rail Home Layout: Two level;Able to live on main level with bedroom/bathroom Home Equipment: Adaptive equipment;Grab bars - tub/shower;Grab bars - toilet Additional Comments: pt reports  they purchased a recliner for pt to utilize while recovering for sleep as their bed is too tall    Prior Function Level of Independence: Independent         Comments: Pt indep with mobility, ADL, and IADL prior to surgery, 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), limped 2/2 hip pain and reports falling "all the time" without clear cause     Hand Dominance   Dominant Hand: Right    Extremity/Trunk Assessment   Upper Extremity Assessment Upper Extremity Assessment: Overall WFL for tasks assessed    Lower Extremity Assessment Lower Extremity Assessment: Defer to PT evaluation;LLE deficits/detail LLE Deficits / Details: expected post-op strength/ROM deficits, hip abduction brace limiting ROM LLE: Unable to fully assess due to immobilization    Cervical / Trunk Assessment Cervical / Trunk Assessment: Normal  Communication   Communication: No difficulties  Cognition Arousal/Alertness: Awake/alert Behavior During Therapy: WFL for tasks assessed/performed Overall Cognitive Status: Within Functional Limits for tasks assessed                                        General Comments General comments (skin integrity, edema, etc.): L hip dressings saturated with drainage, RN came to assist and reinforce prior to ADL mobility attempts    Exercises Other Exercises Other Exercises: Pt instructed in falls prevention, pet care considerations, ADL modifications, AE/DME, precautions, polar care mgt, and home/routines modifications   Assessment/Plan    PT Assessment All further PT needs can be met in the next venue of care;Patent does not need any further PT services  PT Problem List Decreased strength;Decreased range of motion;Decreased activity tolerance;Decreased balance;Decreased mobility       PT Treatment Interventions DME instruction;Gait training;Stair training;Functional mobility training;Therapeutic activities;Therapeutic exercise;Patient/family education;Balance  training    PT Goals (Current goals can be found in the Care Plan section)  Acute Rehab PT Goals Patient Stated Goal: to go home and recover PT Goal Formulation: All assessment and education complete, DC therapy Time For Goal Achievement: 05/03/19 Potential to Achieve Goals: Good    Frequency     Barriers to discharge        Co-evaluation               AM-PAC PT "6 Clicks" Mobility  Outcome Measure Help needed turning from your back to your side while in a flat bed without using bedrails?: A Little Help needed moving from lying on your back to sitting on the side of a flat bed without using bedrails?: A Little Help needed moving to and from a bed to a chair (including a wheelchair)?: A Little Help needed standing up from a chair using your arms (e.g., wheelchair or bedside chair)?: A Little Help needed to walk in hospital room?: A Little Help needed climbing 3-5 steps with a railing? : A Little 6 Click Score: 18    End of Session Equipment Utilized During Treatment: Gait belt(abduction brace LLE) Activity Tolerance: Patient tolerated treatment well;Patient limited by fatigue Patient left: in chair;with call bell/phone within reach;with family/visitor present Nurse Communication: Mobility status;Weight bearing status PT Visit Diagnosis: Difficulty in walking, not elsewhere classified (R26.2);Other abnormalities of gait and mobility (R26.89)    Time: 6812-7517 PT Time Calculation (min) (ACUTE ONLY): 48 min   Charges:   PT Evaluation $PT Eval Low Complexity: 1 Low PT Treatments $Gait Training: 23-37 mins        12:53  PM, 04/26/19 Etta Grandchild, PT, DPT Physical Therapist - [facility]  [phone] ([place])   [place] 04/26/2019, 12:51 PM

## 2019-04-26 NOTE — Anesthesia Postprocedure Evaluation (Signed)
Anesthesia Post Note  Patient: Lexandra Rettke  Procedure(s) Performed: LEFT HIP ARTHROSCOPY, ACETABULOPLASTY, LABRAL REPAIR, FEMORAL OSTEOCHONDROPLASTY, CAPSULAR PLICATION (Left Hip)  Patient location during evaluation: PACU Anesthesia Type: General Level of consciousness: awake and alert Pain management: pain level controlled Vital Signs Assessment: post-procedure vital signs reviewed and stable Respiratory status: spontaneous breathing, nonlabored ventilation and respiratory function stable Cardiovascular status: blood pressure returned to baseline and stable Postop Assessment: no apparent nausea or vomiting Anesthetic complications: no     Last Vitals:  Vitals:   04/26/19 0009 04/26/19 0458  BP: 103/69 104/61  Pulse: 72 72  Resp: 16 17  Temp: 36.9 C 36.7 C  SpO2: 98% 100%    Last Pain:  Vitals:   04/26/19 0458  TempSrc: Oral  PainSc:                  Karleen Hampshire

## 2019-04-26 NOTE — Progress Notes (Signed)
  Subjective: 1 Day Post-Op Procedure(s) (LRB): LEFT HIP ARTHROSCOPY, ACETABULOPLASTY, LABRAL REPAIR, FEMORAL OSTEOCHONDROPLASTY, CAPSULAR PLICATION (Left) Patient reports pain as mild.   Patient is well, and has had no acute complaints or problems, although she does have some nausea this morning. Plan is to go Home after hospital stay. Negative for chest pain and shortness of breath Fever: no Gastrointestinal: Positive for nausea and dry heaves with mild vomiting  Objective: Vital signs in last 24 hours: Temp:  [96.9 F (36.1 C)-98.7 F (37.1 C)] 98 F (36.7 C) (03/23 0458) Pulse Rate:  [66-103] 72 (03/23 0458) Resp:  [7-18] 17 (03/23 0458) BP: (93-117)/(47-77) 104/61 (03/23 0458) SpO2:  [96 %-100 %] 100 % (03/23 0458) Weight:  [77.1 kg] 77.1 kg (03/22 1720)  Intake/Output from previous day:  Intake/Output Summary (Last 24 hours) at 04/26/2019 0706 Last data filed at 04/26/2019 0516 Gross per 24 hour  Intake 5271.4 ml  Output 2505 ml  Net 2766.4 ml    Intake/Output this shift: No intake/output data recorded.  Labs: No results for input(s): HGB in the last 72 hours. No results for input(s): WBC, RBC, HCT, PLT in the last 72 hours. No results for input(s): NA, K, CL, CO2, BUN, CREATININE, GLUCOSE, CALCIUM in the last 72 hours. No results for input(s): LABPT, INR in the last 72 hours.   EXAM General - Patient is Alert and Oriented Extremity - Neurologically intact Neurovascular intact Sensation intact distally Compartment soft Dressing/Incision - clean, dry, clear drainage Motor Function - intact, moving foot and toes well on exam.   Past Medical History:  Diagnosis Date  . Allergy   . Depression   . Diabetes mellitus without complication (HCC)    gastric sleeve  . Diverticulitis   . Frequent headaches   . GERD (gastroesophageal reflux disease)   . IBS (irritable bowel syndrome)   . Thyroid disease     Assessment/Plan: 1 Day Post-Op Procedure(s)  (LRB): LEFT HIP ARTHROSCOPY, ACETABULOPLASTY, LABRAL REPAIR, FEMORAL OSTEOCHONDROPLASTY, CAPSULAR PLICATION (Left) Active Problems:   Femoroacetabular impingement of left hip  Estimated body mass index is 32.12 kg/m as calculated from the following:   Height as of this encounter: 5\' 1"  (1.549 m).   Weight as of this encounter: 77.1 kg. Advance diet Up with therapy D/C IV fluids  Discharge home after physical therapy Flatfoot weightbearing on the left with her abduction brace for 3 weeks.  DVT Prophylaxis - Aspirin Flatfoot Weight-Bearing to left leg  , PA-C Orthopaedic Surgery 04/26/2019, 7:06 AM

## 2019-04-26 NOTE — Progress Notes (Signed)
D/c paperwork reviewed with pt she verbalized understanding.  All scripts were placed in pts d/c packet.  Additional dressing supplies were given.  All belongings packed up including polar care.  Pt wearing brace at time of d/c.  IVs were removed from both hands without issue.  Assisted into wheelchair and wheeled down to med mall ent.  She was assisted into personal vehicle by this nurse.  NAD noted at dc.

## 2019-04-26 NOTE — Evaluation (Signed)
Occupational Therapy Evaluation Patient Details Name: Susan Stokes MRN: [id number] DOB: Jun 22, 1970 Today's Date: 04/26/2019    History of Present Illness 49yo female pt POD1 from L hip arthroscopy, acetabuloplasty, labral repair, demoral osteochondroplasty, and capsular plication. PMHx includes DM, depression, gastric sleeve, IBS, and hx of R RTC repair.   Clinical Impression   Pt was seen for OT evaluation this date. Prior to hospital admission, pt was independent, working from home, and experiencing pain in L hip which caused her to limp. Pt endorses multiple falls without clear cause. Pt lives with her spouse and large golden doodle in a 2 story home. Pt and spouse have prepared for surgery, adding grab bars to walk in shower, toilet, and putting a recliner in the living room for the pt to sleep in during initial recovery. Currently pt demonstrates impairments as described below (See OT problem list) which functionally limit her ability to perform ADL/self-care tasks. Pt currently requires Min A for LB ADL and supervision to CGA for ADL transfers and mobility. Pt instructed in precautions, RW mgt, falls prevention, pet care considerations, ADL modifications, AE/DME, precautions, polar care mgt, and home/routines modifications. Pt verbalized understanding. Handout provided to support recall and carryover. Pt would benefit from skilled OT while hospitalized to address noted impairments and functional limitations (see below for any additional details) in order to maximize safety and independence while minimizing falls risk and caregiver burden. Upon hospital discharge, do not anticipate additional skilled OT needs at this time.    Follow Up Recommendations  No OT follow up    Equipment Recommendations  3 in 1 bedside commode    Recommendations for Other Services       Precautions / Restrictions Precautions Precautions: Other (comment) Precaution Comments: avoid external hip rptation  and hip extension for 4 weeks; hip abduction brace and LLE FFWB for 3 weeks Restrictions Weight Bearing Restrictions: Yes LLE Weight Bearing: Partial weight bearing LLE Partial Weight Bearing Percentage or Pounds: 15-20% - flat foot weight bearing to support balance      Mobility Bed Mobility Overal bed mobility: Needs Assistance Bed Mobility: Supine to Sit     Supine to sit: Min assist;HOB elevated     General bed mobility comments: brace locked was biggest limiting factor, heavy use of BUE to perform  Transfers Overall transfer level: Needs assistance Equipment used: Rolling walker (2 wheeled) Transfers: Sit to/from Stand Sit to Stand: Min guard         General transfer comment: VC for technique and hand/foot placement, steady/cautious throughout    Balance Overall balance assessment: Needs assistance Sitting-balance support: Bilateral upper extremity supported;Feet supported Sitting balance-Leahy Scale: Fair     Standing balance support: Bilateral upper extremity supported Standing balance-Leahy Scale: Fair Standing balance comment: moderate reliance on RW initially, able to take both hands off during toileting hygiene without LOB                           ADL either performed or assessed with clinical judgement   ADL Overall ADL's : Needs assistance/impaired                                       General ADL Comments: indep with UB ADL from seated position, supervision for standing grooming tasks, CGA for toilet transfer with VC for technique to maintain LLE precautions and RW mgt,  and Min A for LB ADL tasks - spouse can assist and pt has reacher     Vision Patient Visual Report: No change from baseline       Perception     Praxis      Pertinent Vitals/Pain Pain Assessment: 0-10 Pain Score: 5  Pain Location: 4-5 throughout session and ADL mobility Pain Descriptors / Indicators: Aching;Discomfort;Grimacing;Guarding Pain  Intervention(s): Limited activity within patient's tolerance;Monitored during session;Premedicated before session;Repositioned;Ice applied     Hand Dominance Right   Extremity/Trunk Assessment Upper Extremity Assessment Upper Extremity Assessment: Overall WFL for tasks assessed   Lower Extremity Assessment Lower Extremity Assessment: Defer to PT evaluation;LLE deficits/detail LLE Deficits / Details: expected post-op strength/ROM deficits, hip abduction brace limiting ROM LLE: Unable to fully assess due to immobilization   Cervical / Trunk Assessment Cervical / Trunk Assessment: Normal   Communication Communication Communication: No difficulties   Cognition Arousal/Alertness: Awake/alert Behavior During Therapy: WFL for tasks assessed/performed Overall Cognitive Status: Within Functional Limits for tasks assessed                                     General Comments  L hip dressings saturated with drainage, RN came to assist and reinforce prior to ADL mobility attempts    Exercises Other Exercises Other Exercises: Pt instructed in falls prevention, pet care considerations, ADL modifications, AE/DME, precautions, polar care mgt, and home/routines modifications   Shoulder Instructions      Home Living Family/patient expects to be discharged to:: Private residence Living Arrangements: Spouse/significant other Available Help at Discharge: Family;Available 24 hours/day Type of Home: House Home Access: Stairs to enter [employer] of Steps: 3 from garage, no rail   Home Layout: Two level;Able to live on main level with bedroom/bathroom     Bathroom Shower/Tub: Producer, television/film/video: Handicapped height     Home Equipment: Adaptive equipment;Grab bars - tub/shower;Grab bars - toilet Adaptive Equipment: Reacher Additional Comments: pt reports they purchased a recliner for pt to utilize while recovering for sleep as their bed is too tall       Prior Functioning/Environment Level of Independence: Independent        Comments: Pt indep with mobility, ADL, and IADL prior to surgery, 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), limped 2/2 hip pain and reports falling "all the time" without clear cause        OT Problem List: Decreased strength;Decreased range of motion;Decreased knowledge of use of DME or AE      OT Treatment/Interventions: Self-care/ADL training;Therapeutic exercise;Therapeutic activities;DME and/or AE instruction;Patient/family education;Balance training    OT Goals(Current goals can be found in the care plan section) Acute Rehab OT Goals Patient Stated Goal: to go home and recover OT Goal Formulation: With patient/family Time For Goal Achievement: 05/10/19 Potential to Achieve Goals: Good ADL Goals Pt Will Perform Lower Body Dressing: with modified independence;with adaptive equipment;sit to/from stand Pt Will Transfer to Toilet: with modified independence;ambulating;bedside commode(LRAD for amb, maintaining FFWBing to LLE) Additional ADL Goal #1: Pt will independently instruct spouse in polar care mgt and precautions.  OT Frequency: Min 1X/week   Barriers to D/C:            Co-evaluation              AM-PAC OT "6 Clicks" Daily Activity     Outcome Measure Help from another person eating meals?: None Help from another person  taking care of personal grooming?: None Help from another person toileting, which includes using toliet, bedpan, or urinal?: A Little Help from another person bathing (including washing, rinsing, drying)?: A Little Help from another person to put on and taking off regular upper body clothing?: None Help from another person to put on and taking off regular lower body clothing?: A Little 6 Click Score: 21   End of Session Equipment Utilized During Treatment: Rolling walker Nurse Communication: Other (comment)(drainage from dressings)  Activity Tolerance: Patient tolerated  treatment well Patient left: in chair;with call bell/phone within reach;with family/visitor present;Other (comment)(abduction brace in place, PT in room for session)  OT Visit Diagnosis: Other abnormalities of gait and mobility (R26.89);Pain Pain - Right/Left: Left Pain - part of body: Hip                Time: 7185-5015 OT Time Calculation (min): 48 min Charges:  OT General Charges $OT Visit: 1 Visit OT Evaluation $OT Eval Moderate Complexity: 1 Mod OT Treatments $Self Care/Home Management : 23-37 mins $Therapeutic Activity: 8-22 mins  Jeni Salles, MPH, MS, OTR/L ascom [phone] 04/26/19, 10:09 AM

## 2019-04-26 NOTE — Progress Notes (Signed)
Dressing reinforced pt tolerated well

## 2019-04-26 NOTE — Discharge Summary (Signed)
Physician Discharge Summary  Subjective: 1 Day Post-Op Procedure(s) (LRB): LEFT HIP ARTHROSCOPY, ACETABULOPLASTY, LABRAL REPAIR, FEMORAL OSTEOCHONDROPLASTY, CAPSULAR PLICATION (Left) Patient reports pain as mild to moderate.   Patient seen in rounds with Dr. Allena Katz. Patient is well, and has had no acute complaints or problems, although has had nausea this morning Patient is ready to go home after physical therapy  Physician Discharge Summary  Patient ID: Susan Stokes MRN: [id number] DOB/AGE: August 07, 1970 49 y.o.  Admit date: 04/25/2019 Discharge date: 04/26/2019  Admission Diagnoses:  Discharge Diagnoses:  Active Problems:   Femoroacetabular impingement of left hip   Discharged Condition: fair  Hospital Course: The patient is postop day 1 from a left hip arthroscopy for femoral acetabular impingement.  Her pain level has been somewhat under control the first night.  She has had some nausea and vomiting.  She did have to have her dressing reinforced because of drainage.  She is ready to go home after physical therapy  Treatments: surgery:  1. Right hip arthroscopy with acetabuloplasty, labral repair, femoral osteochondroplasty, and capsular plication  SURGEON: Rosealee Albee, MD  ASSISTANT: Dedra Skeens, PA  ANESTHESIA: Gen  ESTIMATED BLOOD LOSS:minimal  TOTAL IV FLUIDS: per anesthesia  Discharge Exam: Blood pressure 104/61, pulse 72, temperature 98 F (36.7 C), temperature source Oral, resp. rate 17, height 5\' 1"  (1.549 m), weight 77.1 kg, SpO2 100 %.   Disposition: Discharge disposition: 01-Home or Self Care        Allergies as of 04/26/2019   No Known Allergies     Medication List    TAKE these medications   Armour Thyroid 30 MG tablet Generic drug: thyroid Take 30 mg by mouth daily before breakfast.   aspirin 325 MG EC tablet Take 1 tablet (325 mg total) by mouth daily.   methocarbamol 500 MG tablet Commonly known as: ROBAXIN Take 1  tablet (500 mg total) by mouth every 6 (six) hours as needed for muscle spasms.   multivitamin with minerals Tabs tablet Take 1 tablet by mouth daily.   ondansetron 4 MG tablet Commonly known as: ZOFRAN Take 1 tablet (4 mg total) by mouth every 6 (six) hours as needed for nausea.   oxyCODONE 5 MG immediate release tablet Commonly known as: Oxy IR/ROXICODONE Take 1-2 tablets (5-10 mg total) by mouth every 4 (four) hours as needed for moderate pain (pain score 4-6).   pantoprazole 40 MG tablet Commonly known as: PROTONIX Take 1 tablet (40 mg total) by mouth daily.   traMADol 50 MG tablet Commonly known as: Ultram Take 1 tablet (50 mg total) by mouth every 6 (six) hours as needed.      Follow-up Information    04/28/2019, MD. Go in 2 week(s).   Specialty: Orthopedic Surgery Contact information: [address] [place] [phone]           Signed: [phone], Evynn Boutelle 04/26/2019, 7:15 AM   Objective: Vital signs in last 24 hours: Temp:  [96.9 F (36.1 C)-98.7 F (37.1 C)] 98 F (36.7 C) (03/23 0458) Pulse Rate:  [66-103] 72 (03/23 0458) Resp:  [7-18] 17 (03/23 0458) BP: (93-117)/(47-77) 104/61 (03/23 0458) SpO2:  [96 %-100 %] 100 % (03/23 0458) Weight:  [77.1 kg] 77.1 kg (03/22 1720)  Intake/Output from previous day:  Intake/Output Summary (Last 24 hours) at 04/26/2019 0715 Last data filed at 04/26/2019 0516 Gross per 24 hour  Intake 5271.4 ml  Output 2505 ml  Net 2766.4 ml    Intake/Output this  shift: No intake/output data recorded.  Labs: No results for input(s): HGB in the last 72 hours. No results for input(s): WBC, RBC, HCT, PLT in the last 72 hours. No results for input(s): NA, K, CL, CO2, BUN, CREATININE, GLUCOSE, CALCIUM in the last 72 hours. No results for input(s): LABPT, INR in the last 72 hours.  EXAM: General - Patient is Alert and Oriented Extremity - Neurovascular intact Sensation intact distally Dorsiflexion/Plantar  flexion intact Compartment soft Incision - clean, dry, clear drainage Motor Function -plantarflexion and dorsiflexion of ankle intact.  Assessment/Plan: 1 Day Post-Op Procedure(s) (LRB): LEFT HIP ARTHROSCOPY, ACETABULOPLASTY, LABRAL REPAIR, FEMORAL OSTEOCHONDROPLASTY, CAPSULAR PLICATION (Left) Procedure(s) (LRB): LEFT HIP ARTHROSCOPY, ACETABULOPLASTY, LABRAL REPAIR, FEMORAL OSTEOCHONDROPLASTY, CAPSULAR PLICATION (Left) Past Medical History:  Diagnosis Date  . Allergy   . Depression   . Diabetes mellitus without complication (HCC)    gastric sleeve  . Diverticulitis   . Frequent headaches   . GERD (gastroesophageal reflux disease)   . IBS (irritable bowel syndrome)   . Thyroid disease    Active Problems:   Femoroacetabular impingement of left hip  Estimated body mass index is 32.12 kg/m as calculated from the following:   Height as of this encounter: 5\' 1"  (1.549 m).   Weight as of this encounter: 77.1 kg. Advance diet Up with therapy D/C IV fluids Diet - Regular diet Follow up - in 2 weeks Activity -flatfoot weightbearing with her abduction brace in place Disposition - Home Condition Upon Discharge - Stable DVT Prophylaxis - Aspirin  Reche Dixon, PA-C Orthopaedic Surgery 04/26/2019, 7:15 AM

## 2019-09-20 ENCOUNTER — Other Ambulatory Visit: Payer: Self-pay | Admitting: Student

## 2019-09-20 DIAGNOSIS — R1013 Epigastric pain: Secondary | ICD-10-CM

## 2019-09-20 DIAGNOSIS — R1010 Upper abdominal pain, unspecified: Secondary | ICD-10-CM

## 2019-09-20 DIAGNOSIS — Z9884 Bariatric surgery status: Secondary | ICD-10-CM

## 2019-09-23 ENCOUNTER — Ambulatory Visit
Admission: RE | Admit: 2019-09-23 | Discharge: 2019-09-23 | Disposition: A | Discharge status: Home / Self Care | Payer: BC Managed Care – PPO | Source: Ambulatory Visit | Attending: Student | Admitting: Student

## 2019-09-23 ENCOUNTER — Other Ambulatory Visit: Payer: Self-pay

## 2019-09-23 DIAGNOSIS — R1013 Epigastric pain: Secondary | ICD-10-CM | POA: Diagnosis present

## 2019-09-23 DIAGNOSIS — R1010 Upper abdominal pain, unspecified: Secondary | ICD-10-CM | POA: Diagnosis present

## 2019-09-23 DIAGNOSIS — Z9884 Bariatric surgery status: Secondary | ICD-10-CM | POA: Diagnosis present

## 2020-05-10 ENCOUNTER — Other Ambulatory Visit: Payer: Self-pay | Admitting: Neurology

## 2020-05-10 ENCOUNTER — Encounter: Payer: Self-pay | Admitting: Neurology

## 2020-05-10 ENCOUNTER — Ambulatory Visit (INDEPENDENT_AMBULATORY_CARE_PROVIDER_SITE_OTHER): Payer: BC Managed Care – PPO | Admitting: Neurology

## 2020-05-10 VITALS — BP 110/78 | HR 73 | Ht 61.0 in | Wt 167.0 lb

## 2020-05-10 DIAGNOSIS — K911 Postgastric surgery syndromes: Secondary | ICD-10-CM

## 2020-05-10 DIAGNOSIS — K9089 Other intestinal malabsorption: Secondary | ICD-10-CM

## 2020-05-10 DIAGNOSIS — E063 Autoimmune thyroiditis: Secondary | ICD-10-CM | POA: Diagnosis not present

## 2020-05-10 DIAGNOSIS — H5712 Ocular pain, left eye: Secondary | ICD-10-CM | POA: Diagnosis not present

## 2020-05-10 DIAGNOSIS — K909 Intestinal malabsorption, unspecified: Secondary | ICD-10-CM | POA: Insufficient documentation

## 2020-05-10 DIAGNOSIS — R519 Headache, unspecified: Secondary | ICD-10-CM | POA: Diagnosis not present

## 2020-05-10 MED ORDER — TOPIRAMATE 25 MG PO TABS: 25.0000 mg | ORAL_TABLET | Freq: Two times a day (BID) | ORAL | 3 refills | Status: AC

## 2020-05-10 MED ORDER — PREDNISONE 10 MG PO TABS: 30.0000 mg | ORAL_TABLET | Freq: Every day | ORAL | 0 refills | Status: AC

## 2020-05-10 NOTE — Progress Notes (Signed)
S[facility]EP [facility]INE [facility]IC    Provider:  Melvyn Novas, MD  Primary Care Physician:  Alan Mulder, MD [address] [place] [address]     Referring Provider: Alan Mulder, Md [address] [place],  [place] [address]          Chief Complaint according to patient   Patient presents with:    . New Patient (Initial Visit)     Insomnia, Anxiety, Pt  presents today with new onset HA/Migraines that started early jan 2022. States that the frequency and length of time they were occurring were unmanageable for her. More recently she has developed a swelling /pressure sensation behind the left eye. She has not had imaging completed. PCP gave ibuprofen 600 mg and she felt her stomach turn.    She saw her eye MD and got new glasses and there has been no relief.       HISTORY OF PRESENT ILLNESS:  Susan Stokes is a 50  year old Multiracial  female patient  is seen here upon consultation / referral from Dr Lynnae Prude, on 05/10/2020 . Chief concern according to patient : I have the pleasure of meeting today Susan Stokes 50 weekly, a 50 year old  multiracial female who experienced an onset of a new type of headache as of January of this year.  The frequency and length of time that they have bothered her by now has become a great challenge to her.  She is worried about them the symptoms have also shifted towards more pressure behind the eye and she has felt a knot in the occipital area of the scalp.  Ophthalmologist has not found that there was any intraocular disease and new glasses have not given her any relief.  As to stress and anxiety she reports that she had spent a vacation outside the country and her symptoms did not change while she was not exposed to screens, timelines or work stress.  Worried about her health she has also been less able to sleep and she is not hypersomniac but has reported insomnia.  The first time she experienced headaches at the end of her work day- 3 Pm ,  by 5-6 Pm she notices pain when closing her eyes, pressure behind the eyes. Tired but not able to rest, often not able to eat- Nauseated. phonophobia some photophobia., no visual aura. Her first mea is lunch, sometimes a protein shake, dinner is skipped some nights.       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  Lots of floaters. She had a fall and had hip surgery January 2022.  The patient had a complete hysterectomy over 15 years ago between age 14 and 89.    Social history: from [place],  Patient is working as Nurse, mental health , Animator- and lives in a household with spouse, grown children , one grandchild on the way. Pets are present.  The patient currently works/ used to work in shifts( Chief Technology Officer,)  Tobacco use- never .  ETOH use, rare , Caffeine intake in form of Coffee( 2 cups in AM )  Some energy drinks, 1 a month.. Regular exercise in form of not currently       Review of  Systems: Out of a complete 14 system review, the patient complains of only the following symptoms, and all other reviewed systems are negative.:    New onset headache , nausea, retro-orbital pressure.    Social History   Socioeconomic History  . Marital status: Married    Spouse name: Not on file  . Number of children: Not on file  . Years of education: Not on file  . Highest education level: Not on file  Occupational History  . Not on file  Tobacco Use  . Smoking status: Never Smoker  . Smokeless tobacco: Never Used  Vaping Use  . Vaping Use: Never used  Substance and Sexual Activity  . Alcohol use: Yes    Comment: occaional  . Drug use: No  . Sexual activity: Not  on file  Other Topics Concern  . Not on file  Social History Narrative  . Not on file   Social Determinants of Health   Financial Resource Strain: Not on file  Food Insecurity: Not on file  Transportation Needs: Not on file  Physical Activity: Not on file  Stress: Not on file  Social Connections: Not on file    Family History  Problem Relation Age of Onset  . Stroke Mother   . Diabetes Mother   . Arthritis Father   . Hyperlipidemia Father   . Leukemia Paternal Aunt   . Stroke Maternal Grandmother   . Stroke Paternal Grandmother   . Cancer Paternal Grandfather        lung  . Stroke Paternal Grandfather     Past Medical History:  Diagnosis Date  . Allergy   . Depression   . Diabetes mellitus without complication (HCC)    gastric sleeve  . Diverticulitis   . Frequent headaches   . GERD (gastroesophageal reflux disease)   . IBS (irritable bowel syndrome)   . Thyroid disease     Past Surgical History:  Procedure Laterality Date  . ABDOMINAL HYSTERECTOMY  2004  . APPENDECTOMY  2006  . BREAST REDUCTION SURGERY  2007  . CHOLECYSTECTOMY  2014  . HIP ARTHROSCOPY Left 04/25/2019   Procedure: LEFT HIP ARTHROSCOPY, ACETABULOPLASTY, LABRAL REPAIR, FEMORAL OSTEOCHONDROPLASTY, CAPSULAR PLICATION;  Surgeon: Signa Kell, MD;  Location: [facility] ORS;  Service: Orthopedics;  Laterality: Left;  . LAPAROSCOPIC GASTRIC SLEEVE RESECTION    . SHOULDER ARTHROSCOPY W/ ROTATOR CUFF REPAIR Right      Current Outpatient Medications on File Prior to Visit  Medication Sig Dispense Refill  . ARMOUR THYROID 30 MG tablet Take 30 mg by mouth daily before breakfast.     . ibuprofen (ADVIL) 800 MG tablet Take 800 mg by mouth 2 (two) times daily. (Patient not taking: Reported on 05/10/2020)     No current facility-administered medications on file prior to visit.    No Known Allergies  Physical exam:  Today's Vitals   05/10/20 1104  BP: 110/78  Pulse: 73  Weight: 167 lb (75.8 kg)  Height:  5\' 1"  (1.549 m)   Body mass index is 31.55 kg/m.   Wt Readings from Last 3 Encounters:  05/10/20 167 lb (75.8 kg)  04/25/19 169 lb 15.6 oz (77.1 kg)  04/21/19 170 lb (77.1 kg)     Ht Readings from Last 3 Encounters:  05/10/20 5\' 1"  (1.549 m)  04/25/19 5\' 1"  (1.549 m)  04/21/19 5\' 1"  (1.549 m)      General: The patient is awake, alert and appears not in acute distress. The patient is well  groomed. Head: Normocephalic, atraumatic. Neck is supple. Mallampati 1   Nasal airflow is patent.  Retrognathia is not seen.  Dental status: intact  Cardiovascular:  Regular rate and cardiac rhythm by pulse,  without distended neck veins. Respiratory: Lungs are clear to auscultation.  Skin:  Without evidence of ankle edema, or rash. Trunk: The patient's posture is erect.   Neurologic exam : The patient is awake and alert, oriented to place and time.   Memory subjective described as intact.  Attention span & concentration ability appears normal.  Speech is fluent,  without  dysarthria, dysphonia or aphasia.  Mood and affect are appropriate.   Cranial nerves: no loss of smell or taste reported  Pupils are equal and briskly reactive to light. Funduscopic exam: no edema and  no pallor.   Extraocular movements in vertical and horizontal planes were intact and without nystagmus.  No Diplopia. Visual fields by finger perimetry are intact. Hearing was intact to soft voice and finger rubbing.    Facial sensation intact to fine touch.  Facial motor strength is symmetric and tongue and uvula move midline.  Neck ROM : rotation, tilt and flexion extension were normal for age and shoulder shrug was symmetrical.    Motor exam:  Symmetric bulk, tone and ROM.   Normal tone without cog wheeling, symmetric grip strength .   Sensory:  Fine touch, pinprick and vibration were normal.  Proprioception tested in the upper extremities was normal.   Coordination: Rapid alternating movements in the fingers/hands  were of normal speed.  The Finger-to-nose maneuver was intact without evidence of ataxia, dysmetria or tremor.   Gait and station: Patient could rise unassisted from a seated position, walked without assistive device.  Ascending to the exam table without difficulties,  Stance is of normal width/ base and the patient turned with 3 steps Toe and heel walk were deferred.   Deep tendon reflexes: in the  upper and lower extremities are symmetric and intact.  Babinski response was deferred .      After spending a total time of 50 minutes face to face and additional time for physical and neurologic examination, review of laboratory studies,  personal review of imaging studies, reports and results of other testing and review of referral information / records as far as provided in visit, I have established the following assessments:  1)  Unexplained sudden onset of headaches that combine migrainous features and tension, as well as pressure headache.  2)  neck tension and right paraspinal tender point have not been better after repeat chiropractic treatment.  3) status post thyroiditis and DM , now diet controlled. 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 Had bariatric surgery. Steatorrhea and dumping syndrome.    My Plan is to proceed with:  1)  Imaging of the brain - MRI , with and without. No claustrophobia.  2)  Start topiramate 25 mg bid po.  3)  Electrolyte-  I would like to thank  Alan MulderMorayati, Shamil J, Md [address] [place] [phone] for allowing me to meet with and to take care of this pleasant patient.   In short, Patrecia PourLisa Perrault Whitley is presenting with new onset headaches and pain in neck and retro-orbital , lasting hours, and  not combined with flushing , tearing or vertigo. Not woken out of sleep by HA. Insomnia unrelated , more to anxiety.   I plan to follow up either personally or through our NP within 3-4  month.     Electronically signed  by: Melvyn Novas, MD 05/10/2020 11:33 AM  [facility] Board certified by The [employer] of Sleep Medicine and Diplomate of the [employer] of Sleep Medicine. Board certified In Neurology through the ABPN, Fellow of the [employer] of Neu[employer]. Medical Director of [facility].

## 2020-05-10 NOTE — Patient Instructions (Addendum)
Analgesic Rebound Headache An analgesic rebound headache, sometimes called a medication overuse headache or a drug-induced headache, is a secondary disorder that is caused by the overuse of pain medicine (analgesic) to treat the original (primary) headache. Any type of primary headache can return as a rebound headache if a person regularly takes analgesics. The types of primary headaches that are commonly associated with rebound headaches include:  Migraines.  Headaches that are caused by tense muscles in the head and neck area (tension headaches).  Headaches that develop and happen again on one side of the head and around the eye (cluster headaches). If rebound headaches continue, they can become long-term, daily headaches. What are the causes? This condition may be caused by frequent use of:  Over-the-counter medicines such as aspirin, ibuprofen, and acetaminophen.  Sinus-relief medicines and medicines that contain caffeine.  Narcotic pain medicines such as codeine and oxycodone.  Some prescription migraine medicines. What are the signs or symptoms? The symptoms of a rebound headache are the same as the symptoms of the original headache. Some of the symptoms of specific types of headaches include: Migraine headache  Pulsing or throbbing pain on one or both sides of the head.  Severe pain that interferes with daily activities.  Pain that gets worse with physical activity.  Nausea, vomiting, or both.  Pain and sensitivity with exposure to bright light, loud noises, or strong smells.  Visual changes.  Numbness of one or both arms. Tension headache  Pressure around the head.  Dull, aching head pain.  Pain felt over the front and sides of the head.  Tenderness in the muscles of the head, neck, and shoulders. Cluster headache  Severe pain that begins in or around one eye or temple.  Droopy or swollen eyelid, or redness and tearing in the eye on the same side as the  pain.  One-sided head pain.  Nausea.  Runny nose.  Sweaty, pale facial skin.  Restlessness. How is this diagnosed? This condition is diagnosed by:  Reviewing your medical history. This includes the nature of your primary headaches.  Reviewing the types of pain medicines that you have been using to treat your primary headaches and how often you take them. How is this treated? This condition may be treated or managed by:  Discontinuing frequent use of the analgesic medicine. Doing this may worsen your headaches at first, but the pain should eventually become more manageable, less frequent, and less severe.  Seeing a headache specialist. He or she may be able to help you manage your headaches and help make sure there is not another cause of the headaches.  Using methods of stress relief, such as acupuncture, counseling, biofeedback, and massage. Talk with your health care provider about which methods might be good for you. Follow these instructions at home: Medicines  Take over-the-counter and prescription medicines only as told by your health care provider.  Stop the repeated use of pain medicine as told by your health care provider. Stopping can be difficult. Carefully follow instructions from your health care provider.   Lifestyle  Follow a regular sleep schedule. Do not vary the time that you go to bed or the amount that you sleep from day to day. It is important to stay on the same schedule to help prevent headaches. Get 7-9 hours of sleep each night, or the amount recommended by your health care provider.  Exercise regularly. Exercise for at least 30 minutes, 5 times each week.  Limit or manage stress. Consider  stress-relief options such as acupuncture, counseling, biofeedback, and massage. Talk with your health care provider about which methods might be good for you.  Do not drink alcohol.  Do not use any products that contain nicotine or tobacco, such as cigarettes,  e-cigarettes, and chewing tobacco. If you need help quitting, ask your health care provider.   General instructions  Avoid triggers that are known to cause your primary headaches.  Keep all follow-up visits as told by your health care provider. This is important. Contact a health care provider if:  You continue to experience headaches after following treatments that your health care provider recommended. Get help right away if you have:  New headache pain.  Headache pain that is different than what you have experienced in the past.  Numbness or tingling in your arms or legs.  Changes in your speech or vision. Summary  An analgesic rebound headache, sometimes called a medication overuse headache or a drug-induced headache, is caused by the overuse of pain medicine (analgesic) to treat the original (primary) headache.  Any type of primary headache can return as a rebound headache if a person regularly takes analgesics. The types of primary headaches that are commonly associated with rebound headaches include migraines, tension headaches, and cluster headaches.  Analgesic rebound headaches can occur with frequent use of over-the-counter medicines and prescription medicines.  Treatment involves stopping the medicine that is being overused. This will improve headache frequency and severity. This information is not intended to replace advice given to you by your health care provider. Make sure you discuss any questions you have with your health care provider. Document Revised: 02/17/2019 Document Reviewed: 02/17/2019 Elsevier Patient Education  2021 [employer] Topiramate tablets What is this medicine? TOPIRAMATE (toe PYRE a mate) is used to treat seizures in adults or children with epilepsy. It is also used for the prevention of migraine headaches. This medicine may be used for other purposes; ask your health care provider or pharmacist if you have questions. COMMON BRAND NAME(S):  Topamax, Topiragen What should I tell my health care provider before I take this medicine? They need to know if you have any of these conditions:  bleeding disorder  kidney disease  lung disease  suicidal thoughts, plans, or attempt  an unusual or allergic reaction to topiramate, other medicines, foods, dyes, or preservatives  pregnant or trying to get pregnant  breast-feeding How should I use this medicine? Take this medicine by mouth with a glass of water. Follow the directions on the prescription label. Do not cut, crush or chew this medicine. Swallow the tablets whole. You can take it with or without food. If it upsets your stomach, take it with food. Take your medicine at regular intervals. Do not take it more often than directed. Do not stop taking except on your doctor's advice. A special MedGuide will be given to you by the pharmacist with each prescription and refill. Be sure to read this information carefully each time. Talk to your pediatrician regarding the use of this medicine in children. While this drug may be prescribed for children as young as 71 years of age for selected conditions, precautions do apply. Overdosage: If you think you have taken too much of this medicine contact a poison control center or emergency room at once. NOTE: This medicine is only for you. Do not share this medicine with others. What if I miss a dose? If you miss a dose, take it as soon as you can. If your next  dose is to be taken in less than 6 hours, then do not take the missed dose. Take the next dose at your regular time. Do not take double or extra doses. What may interact with this medicine? This medicine may interact with the following medications:  acetazolamide  alcohol  antihistamines for allergy, cough, and cold  aspirin and aspirin-like medicines  atropine  birth control pills  certain medicines for anxiety or sleep  certain medicines for bladder problems like oxybutynin,  tolterodine  certain medicines for depression like amitriptyline, fluoxetine, sertraline  certain medicines for seizures like carbamazepine, phenobarbital, phenytoin, primidone, valproic acid, zonisamide  certain medicines for stomach problems like dicyclomine, hyoscyamine  certain medicines for travel sickness like scopolamine  certain medicines for Parkinson's disease like benztropine, trihexyphenidyl  certain medicines that treat or prevent blood clots like warfarin, enoxaparin, dalteparin, apixaban, dabigatran, and rivaroxaban  digoxin  general anesthetics like halothane, isoflurane, methoxyflurane, propofol  hydrochlorothiazide  ipratropium  lithium  medicines that relax muscles for surgery  metformin  narcotic medicines for pain  NSAIDs, medicines for pain and inflammation, like ibuprofen or naproxen  phenothiazines like chlorpromazine, mesoridazine, prochlorperazine, thioridazine  pioglitazone This list may not describe all possible interactions. Give your health care provider a list of all the medicines, herbs, non-prescription drugs, or dietary supplements you use. Also tell them if you smoke, drink alcohol, or use illegal drugs. Some items may interact with your medicine. What should I watch for while using this medicine? Visit your doctor or health care professional for regular checks on your progress. Tell your health care professional if your symptoms do not start to get better or if they get worse. Do not stop taking except on your health care professional's advice. You may develop a severe reaction. Your health care professional will tell you how much medicine to take. Wear a medical ID bracelet or chain. Carry a card that describes your disease and details of your medicine and dosage times. This medicine can reduce the response of your body to heat or cold. Dress warm in cold weather and stay hydrated in hot weather. If possible, avoid extreme temperatures like  saunas, hot tubs, very hot or cold showers, or activities that can cause dehydration such as vigorous exercise. Check with your health care professional if you have severe diarrhea, nausea, and vomiting, or if you sweat a lot. The loss of too much body fluid may make it dangerous for you to take this medicine. You may get drowsy or dizzy. Do not drive, use machinery, or do anything that needs mental alertness until you know how this medicine affects you. Do not stand up or sit up quickly, especially if you are an older patient. This reduces the risk of dizzy or fainting spells. Alcohol may interfere with the effect of this medicine. Avoid alcoholic drinks. Tell your health care professional right away if you have any change in your eyesight. Patients and their families should watch out for new or worsening depression or thoughts of suicide. Also watch out for sudden changes in feelings such as feeling anxious, agitated, panicky, irritable, hostile, aggressive, impulsive, severely restless, overly excited and hyperactive, or not being able to sleep. If this happens, especially at the beginning of treatment or after a change in dose, call your healthcare professional. This medicine may cause serious skin reactions. They can happen weeks to months after starting the medicine. Contact your health care provider right away if you notice fevers or flu-like symptoms with a rash.  The rash may be red or purple and then turn into blisters or peeling of the skin. Or, you might notice a red rash with swelling of the face, lips or lymph nodes in your neck or under your arms. Birth control may not work properly while you are taking this medicine. Talk to your health care professional about using an extra method of birth control. Women should inform their health care professional if they wish to become pregnant or think they might be pregnant. There is a potential for serious side effects and harm to an unborn child. Talk to  your health care professional for more information. What side effects may I notice from receiving this medicine? Side effects that you should report to your doctor or health care professional as soon as possible:  allergic reactions like skin rash, itching or hives, swelling of the face, lips, or tongue  blood in the urine  changes in vision  confusion  loss of memory  pain in lower back or side  pain when urinating  redness, blistering, peeling or loosening of the skin, including inside the mouth  signs and symptoms of bleeding such as bloody or black, tarry stools; red or dark brown urine; spitting up blood or brown material that looks like coffee grounds; red spots on the skin; unusual bruising or bleeding from the eyes, gums, or nose  signs and symptoms of increased acid in the body like breathing fast; fast heartbeat; headache; confusion; unusually weak or tired; nausea, vomiting  suicidal thoughts, mood changes  trouble speaking or understanding  unusual sweating  unusually weak or tired Side effects that usually do not require medical attention (report to your doctor or health care professional if they continue or are bothersome):  dizziness  drowsiness  fever  loss of appetite  nausea, vomiting  pain, tingling, numbness in the hands or feet  stomach pain  tiredness  upset stomach This list may not describe all possible side effects. Call your doctor for medical advice about side effects. You may report side effects to FDA at 1-800-FDA-1088. Where should I keep my medicine? Keep out of the reach of children and pets. Store between 15 and 30 degrees C (59 and 86 degrees F). Protect from moisture. Keep the container tightly closed. Get rid of any unused medicine after the expiration date. To get rid of medicines that are no longer needed or have expired:  Take the medicine to a medicine take-back program. Check with your pharmacy or law enforcement to find  a location.  If you cannot return the medicine, check the label or package insert to see if the medicine should be thrown out in the garbage or flushed down the toilet. If you are not sure, ask your health care provider. If it is safe to put it in the trash, empty the medicine out of the container. Mix the medicine with cat litter, dirt, coffee grounds, or other unwanted substance. Seal the mixture in a bag or container. Put it in the trash. NOTE: This sheet is a summary. It may not cover all possible information. If you have questions about this medicine, talk to your doctor, pharmacist, or health care provider.  2021 Elsevier/Gold Standard (2019-08-04 15:41:57)

## 2020-05-11 NOTE — Progress Notes (Signed)
Still low Vitamin D, I recommend to take 2000 units po a day, this can be an over the counter supplement. Your iron saturation is also very low and ferritin should be above 50 ( not the lab's stated normal values, but experience)  . Consider taking a prenatal vitamin with easier digestible iron. Otherwise normal.

## 2020-05-15 ENCOUNTER — Telehealth: Payer: Self-pay | Admitting: Neurology

## 2020-05-15 NOTE — Telephone Encounter (Signed)
Called the patient and advised her of the lab findings. Pt has already started taking the prenatal supplement. She also has already been prescribed a prescription strength vitamin d. Pt was inquiring about the MRI and states no one has called her to schedule this yet. Advised the patient they may still be working on insurance authorization but that I would check in with our MRI person to let them know she was asking. She verbalized understanding and had no questions.

## 2020-05-15 NOTE — Telephone Encounter (Signed)
-----   Message from Melvyn Novas, MD sent at 05/11/2020 11:53 AM EDT ----- Still low Vitamin D, I recommend to take 2000 units po a day, this can be an over the counter supplement. Your iron saturation is also very low and ferritin should be above 50 ( not the lab's stated normal values, but experience)  . Consider taking a prenatal vitamin with easier digestible iron. Otherwise normal.

## 2020-05-16 NOTE — Progress Notes (Signed)
We are still waiting for Vit A level.  Vit D was low, iron metabolism with low ferritin ( under 50 ) low normal iron saturation and high iron binding capacity, iron saturation was similar to 7 years ago.  Creatinine is fine- AST/ ALT were highly abnormal 12 month ago, but now are normal.   Consider taking  daily a multivitamin with some iron ( prenatal vitamins/ multiminerals )

## 2020-05-19 ENCOUNTER — Encounter: Payer: Self-pay | Admitting: Neurology

## 2020-05-23 LAB — CBC WITH DIFFERENTIAL/PLATELET
Basophils Absolute: 0 10*3/uL (ref 0.0–0.2)
Basos: 1 %
EOS (ABSOLUTE): 0.1 10*3/uL (ref 0.0–0.4)
Eos: 2 %
Hematocrit: 39.4 % (ref 34.0–46.6)
Hemoglobin: 12.5 g/dL (ref 11.1–15.9)
Immature Grans (Abs): 0 10*3/uL (ref 0.0–0.1)
Immature Granulocytes: 0 %
Lymphocytes Absolute: 1.8 10*3/uL (ref 0.7–3.1)
Lymphs: 43 %
MCH: 27.7 pg (ref 26.6–33.0)
MCHC: 31.7 g/dL (ref 31.5–35.7)
MCV: 87 fL (ref 79–97)
Monocytes Absolute: 0.4 10*3/uL (ref 0.1–0.9)
Monocytes: 11 %
Neutrophils Absolute: 1.8 10*3/uL (ref 1.4–7.0)
Neutrophils: 43 %
Platelets: 241 10*3/uL (ref 150–450)
RBC: 4.52 x10E6/uL (ref 3.77–5.28)
RDW: 12.6 % (ref 11.7–15.4)
WBC: 4.2 10*3/uL (ref 3.4–10.8)

## 2020-05-23 LAB — IRON,TIBC AND FERRITIN PANEL
Ferritin: 20 ng/mL (ref 15–150)
Iron Saturation: 17 % (ref 15–55)
Iron: 67 ug/dL (ref 27–159)
Total Iron Binding Capacity: 384 ug/dL (ref 250–450)
UIBC: 317 ug/dL (ref 131–425)

## 2020-05-23 LAB — COMPREHENSIVE METABOLIC PANEL
ALT: 9 IU/L (ref 0–32)
AST: 12 IU/L (ref 0–40)
Albumin/Globulin Ratio: 1.8 (ref 1.2–2.2)
Albumin: 4.5 g/dL (ref 3.8–4.8)
Alkaline Phosphatase: 75 IU/L (ref 44–121)
BUN/Creatinine Ratio: 21 (ref 9–23)
BUN: 11 mg/dL (ref 6–24)
Bilirubin Total: 0.2 mg/dL (ref 0.0–1.2)
CO2: 23 mmol/L (ref 20–29)
Calcium: 9.3 mg/dL (ref 8.7–10.2)
Chloride: 105 mmol/L (ref 96–106)
Creatinine, Ser: 0.52 mg/dL — ABNORMAL LOW (ref 0.57–1.00)
Globulin, Total: 2.5 g/dL (ref 1.5–4.5)
Glucose: 74 mg/dL (ref 65–99)
Potassium: 4.8 mmol/L (ref 3.5–5.2)
Sodium: 143 mmol/L (ref 134–144)
Total Protein: 7 g/dL (ref 6.0–8.5)
eGFR: 115 mL/min/{1.73_m2} (ref >59)

## 2020-05-23 LAB — VITAMIN A: Vitamin A: 26.7 ug/dL (ref 20.1–62.0)

## 2020-05-23 LAB — VITAMIN D 25 HYDROXY (VIT D DEFICIENCY, FRACTURES): Vit D, 25-Hydroxy: 28.6 ng/mL — ABNORMAL LOW (ref 30.0–100.0)

## 2020-05-23 LAB — VITAMIN K1, SERUM

## 2020-05-23 NOTE — Progress Notes (Signed)
Low normal vitamin A level, low vitamin D, and low ferritin level.

## 2020-07-11 ENCOUNTER — Ambulatory Visit: Payer: BC Managed Care – PPO | Admitting: Neurology

## 2020-08-09 ENCOUNTER — Ambulatory Visit: Payer: BC Managed Care – PPO | Admitting: Neurology

## 2021-02-04 IMAGING — RF DG HIP (WITH PELVIS) OPERATIVE*L*
1 series · 6 of 6 positions shown · non-contrast
Comparison: CT left hip 04/18/2019.

CLINICAL DATA: Left hip surgery.

EXAM:
OPERATIVE left HIP (WITH PELVIS IF PERFORMED) 6 VIEWS
TECHNIQUE: Fluoroscopic spot image(s) were submitted for interpretation
post-operatively.

[Series 1: run · 6 of 6 slices shown]
[im 1/6]
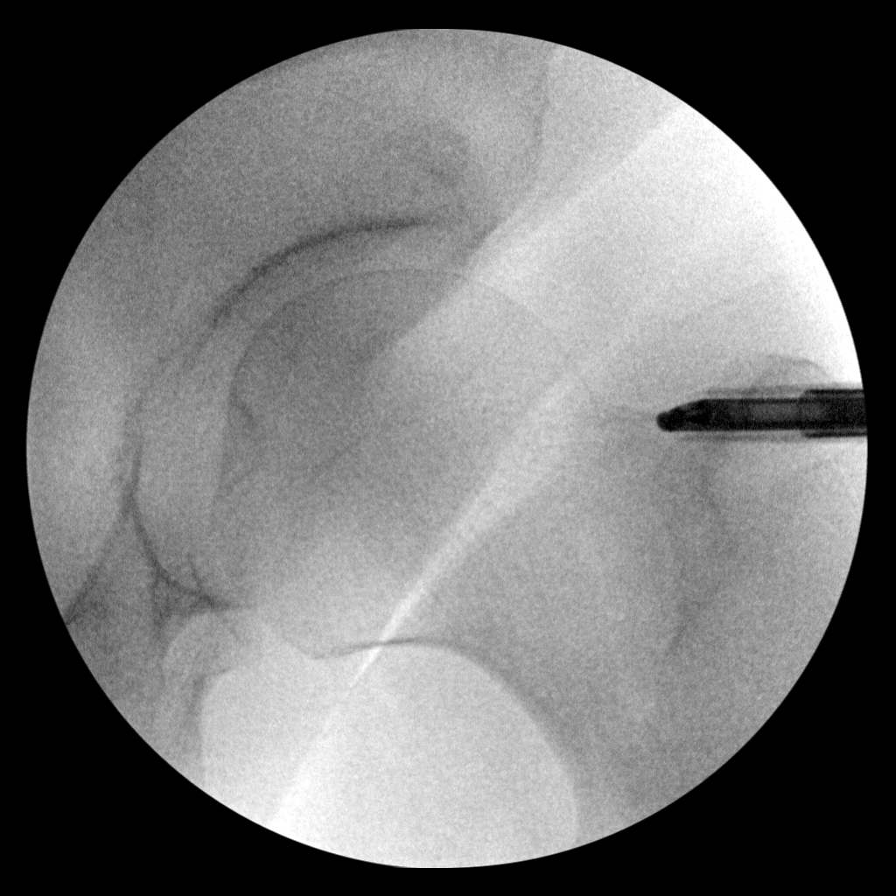
[im 2/6]
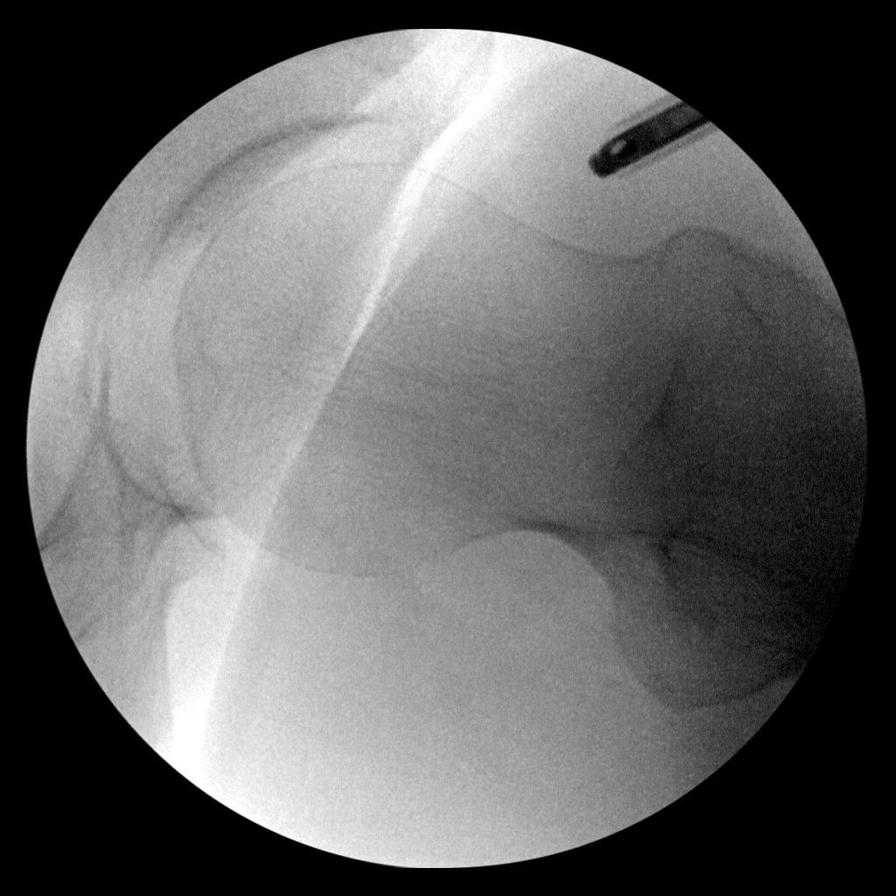
[im 3/6]
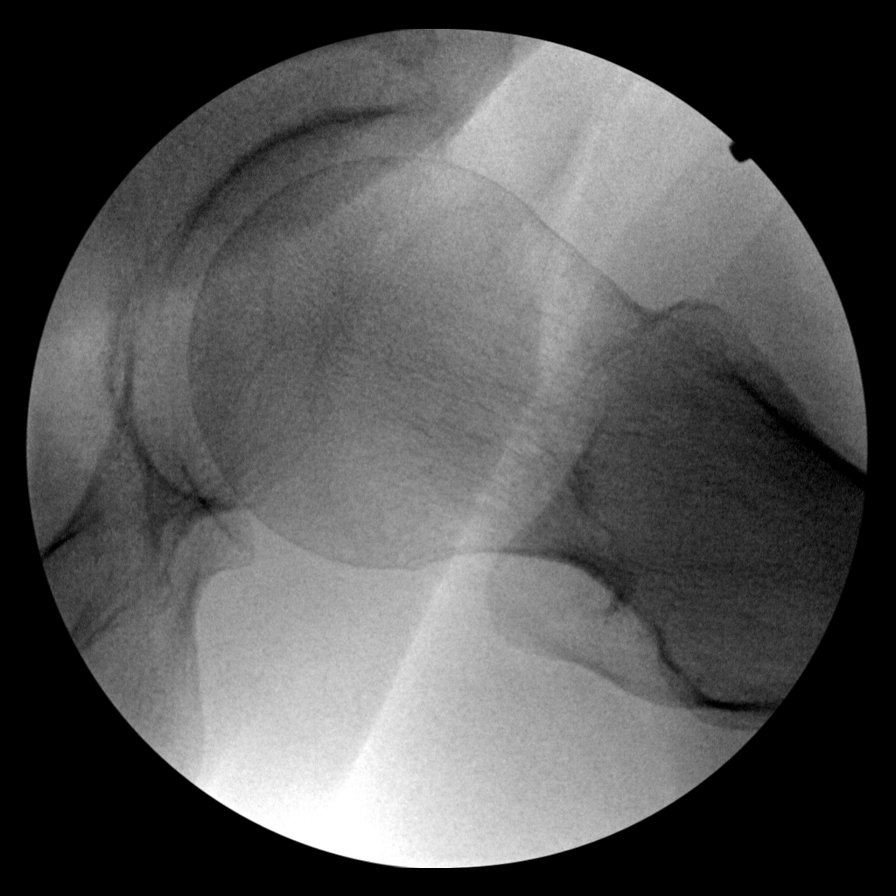
[im 4/6]
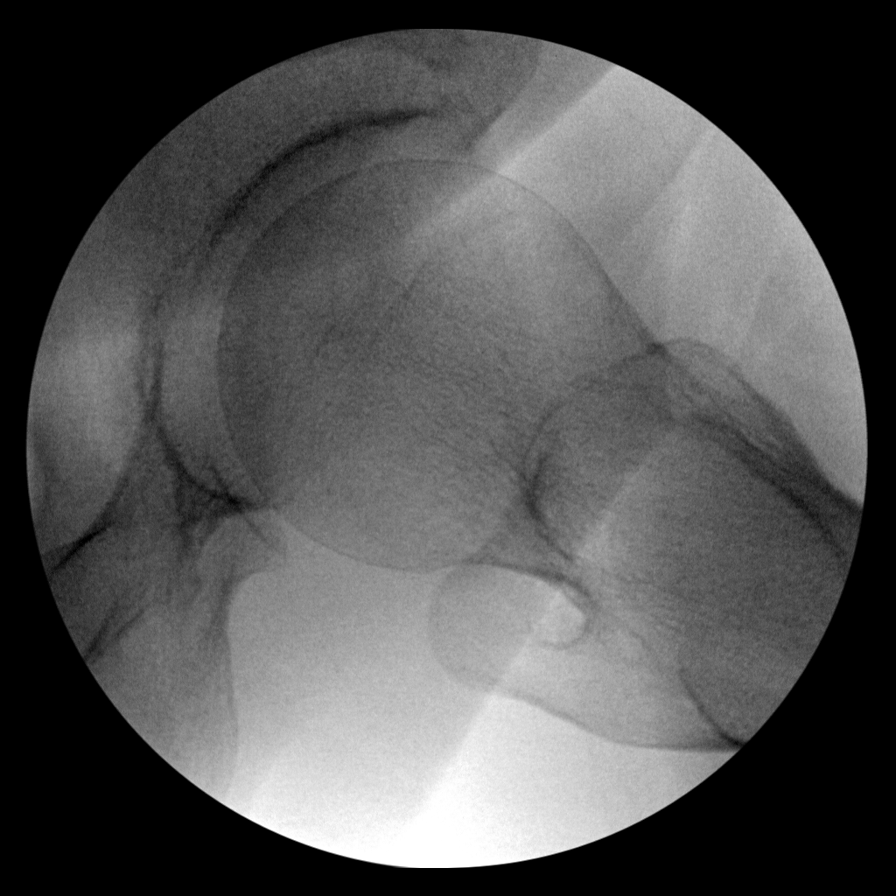
[im 5/6]
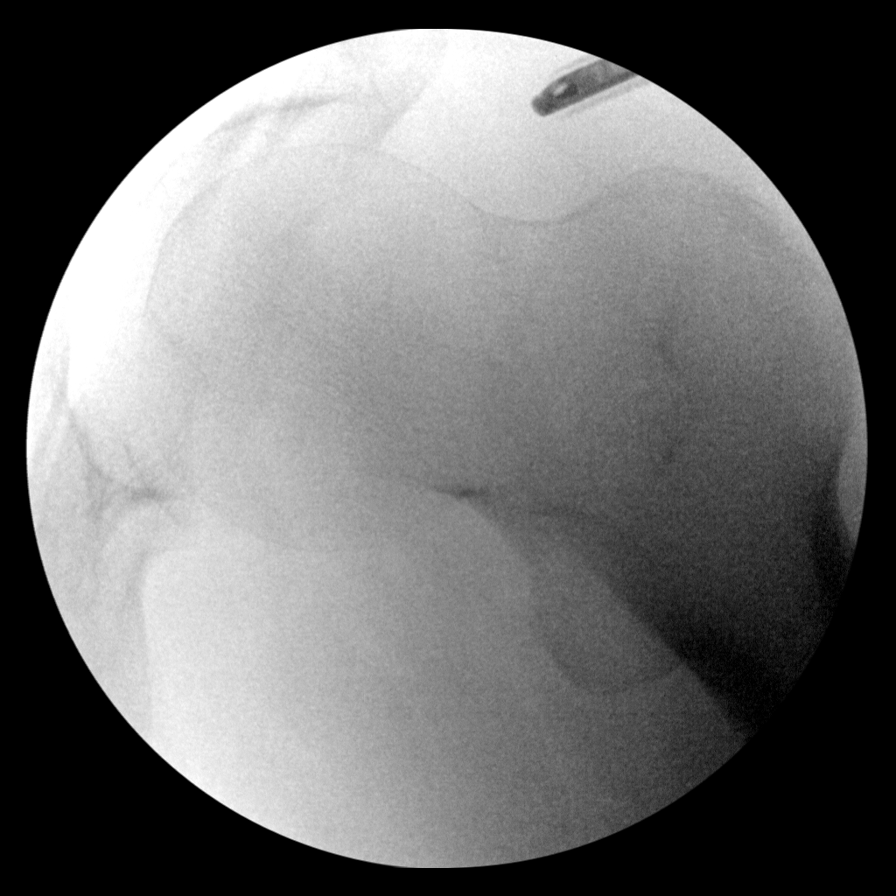
[im 6/6]
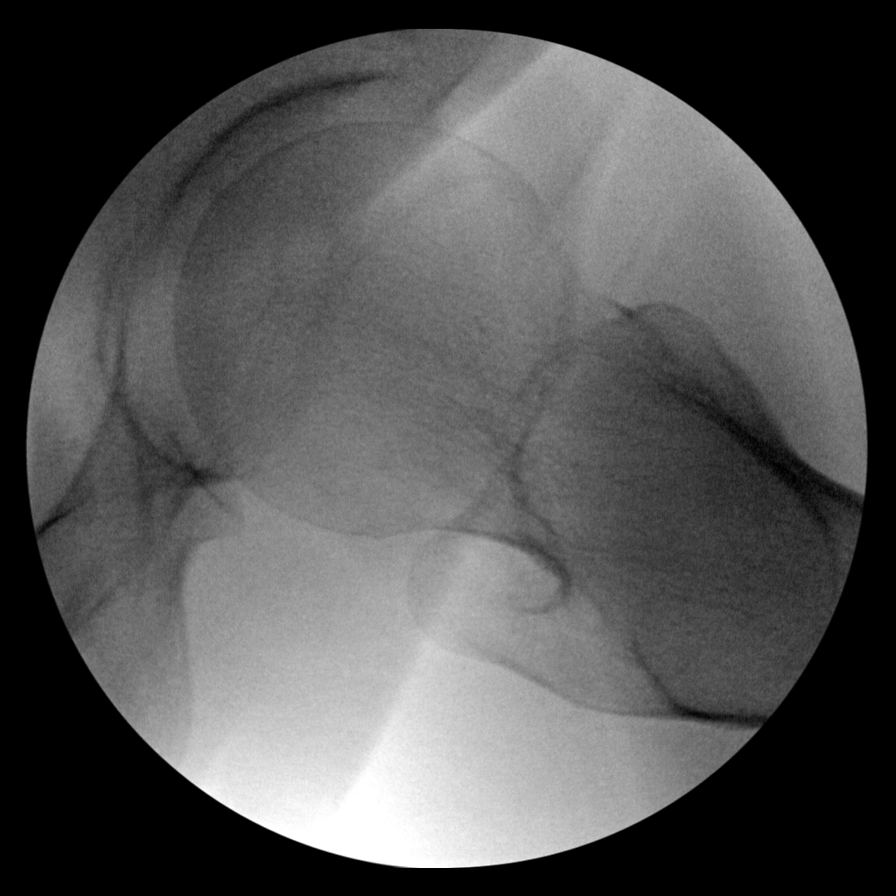

[6 of 6 positions shown; findings below may reference images not displayed]

FINDINGS: 6 intraoperative fluoroscopic spot views of the left hip show normal
hip joint space. On some images, a surgical instrument tip is seen
along the left femoral neck.
IMPRESSION: Intraoperative visualization of the left hip.

## 2021-03-21 ENCOUNTER — Ambulatory Visit: Payer: BC Managed Care – PPO | Admitting: Dermatology

## 2021-04-02 ENCOUNTER — Other Ambulatory Visit: Payer: Self-pay | Admitting: Orthopedic Surgery

## 2021-04-02 DIAGNOSIS — S46102A Unspecified injury of muscle, fascia and tendon of long head of biceps, left arm, initial encounter: Secondary | ICD-10-CM

## 2021-04-02 DIAGNOSIS — M25512 Pain in left shoulder: Secondary | ICD-10-CM

## 2021-04-09 ENCOUNTER — Other Ambulatory Visit: Payer: Self-pay

## 2021-04-09 ENCOUNTER — Ambulatory Visit
Admission: RE | Admit: 2021-04-09 | Discharge: 2021-04-09 | Disposition: A | Discharge status: Home / Self Care | Payer: BC Managed Care – PPO | Source: Ambulatory Visit | Attending: Orthopedic Surgery | Admitting: Orthopedic Surgery

## 2021-04-09 DIAGNOSIS — M25512 Pain in left shoulder: Secondary | ICD-10-CM | POA: Diagnosis not present

## 2021-04-09 DIAGNOSIS — S46102A Unspecified injury of muscle, fascia and tendon of long head of biceps, left arm, initial encounter: Secondary | ICD-10-CM

## 2021-08-02 ENCOUNTER — Other Ambulatory Visit: Payer: Self-pay | Admitting: Orthopedic Surgery

## 2021-08-05 ENCOUNTER — Other Ambulatory Visit: Payer: Self-pay

## 2021-08-05 ENCOUNTER — Encounter
Admission: RE | Disposition: A | Discharge status: Home / Self Care | Payer: Self-pay | Source: Home / Self Care | Attending: Orthopedic Surgery

## 2021-08-05 ENCOUNTER — Ambulatory Visit
Admission: RE | Admit: 2021-08-05 | Discharge: 2021-08-05 | Disposition: A | Discharge status: Home / Self Care | Payer: BC Managed Care – PPO | Attending: Orthopedic Surgery | Admitting: Orthopedic Surgery

## 2021-08-05 ENCOUNTER — Encounter: Payer: Self-pay | Admitting: Orthopedic Surgery

## 2021-08-05 ENCOUNTER — Ambulatory Visit: Payer: BC Managed Care – PPO | Admitting: Anesthesiology

## 2021-08-05 ENCOUNTER — Ambulatory Visit: Payer: BC Managed Care – PPO

## 2021-08-05 DIAGNOSIS — M659 Synovitis and tenosynovitis, unspecified: Secondary | ICD-10-CM | POA: Diagnosis not present

## 2021-08-05 DIAGNOSIS — Z791 Long term (current) use of non-steroidal anti-inflammatories (NSAID): Secondary | ICD-10-CM | POA: Insufficient documentation

## 2021-08-05 DIAGNOSIS — E039 Hypothyroidism, unspecified: Secondary | ICD-10-CM | POA: Insufficient documentation

## 2021-08-05 DIAGNOSIS — M7502 Adhesive capsulitis of left shoulder: Secondary | ICD-10-CM | POA: Diagnosis present

## 2021-08-05 DIAGNOSIS — M7522 Bicipital tendinitis, left shoulder: Secondary | ICD-10-CM | POA: Insufficient documentation

## 2021-08-05 DIAGNOSIS — Z6826 Body mass index (BMI) 26.0-26.9, adult: Secondary | ICD-10-CM | POA: Diagnosis not present

## 2021-08-05 DIAGNOSIS — E669 Obesity, unspecified: Secondary | ICD-10-CM | POA: Diagnosis not present

## 2021-08-05 DIAGNOSIS — M7552 Bursitis of left shoulder: Secondary | ICD-10-CM | POA: Insufficient documentation

## 2021-08-05 DIAGNOSIS — Z7983 Long term (current) use of bisphosphonates: Secondary | ICD-10-CM | POA: Insufficient documentation

## 2021-08-05 DIAGNOSIS — S43432A Superior glenoid labrum lesion of left shoulder, initial encounter: Secondary | ICD-10-CM | POA: Insufficient documentation

## 2021-08-05 DIAGNOSIS — M25312 Other instability, left shoulder: Secondary | ICD-10-CM | POA: Diagnosis not present

## 2021-08-05 DIAGNOSIS — M8580 Other specified disorders of bone density and structure, unspecified site: Secondary | ICD-10-CM | POA: Diagnosis not present

## 2021-08-05 DIAGNOSIS — E119 Type 2 diabetes mellitus without complications: Secondary | ICD-10-CM | POA: Diagnosis not present

## 2021-08-05 DIAGNOSIS — Z9884 Bariatric surgery status: Secondary | ICD-10-CM | POA: Insufficient documentation

## 2021-08-05 DIAGNOSIS — X58XXXA Exposure to other specified factors, initial encounter: Secondary | ICD-10-CM | POA: Insufficient documentation

## 2021-08-05 DIAGNOSIS — R519 Headache, unspecified: Secondary | ICD-10-CM | POA: Insufficient documentation

## 2021-08-05 HISTORY — PX: LYSIS OF ADHESION: SHX5961

## 2021-08-05 LAB — GLUCOSE, CAPILLARY
Glucose-Capillary: 77 mg/dL (ref 70–99)
Glucose-Capillary: 128 mg/dL — ABNORMAL HIGH (ref 70–99)

## 2021-08-05 LAB — BASIC METABOLIC PANEL
Anion gap: 9 (ref 5–15)
BUN: 13 mg/dL (ref 6–20)
CO2: 25 mmol/L (ref 22–32)
Calcium: 9.2 mg/dL (ref 8.9–10.3)
Chloride: 104 mmol/L (ref 98–111)
Creatinine, Ser: 0.52 mg/dL (ref 0.44–1.00)
GFR, Estimated: >60 mL/min (ref >60)
Glucose, Bld: 83 mg/dL (ref 70–99)
Potassium: 3.7 mmol/L (ref 3.5–5.1)
Sodium: 138 mmol/L (ref 135–145)

## 2021-08-05 SURGERY — LAPAROTOMY, FOR LYSIS OF ADHESIONS
Anesthesia: Regional | Site: Shoulder | Laterality: Left

## 2021-08-05 MED ORDER — OXYCODONE HCL 5 MG/5ML PO SOLN: 5.0000 mg | Freq: Once | ORAL | Status: DC | PRN

## 2021-08-05 MED ORDER — EPHEDRINE SULFATE (PRESSORS) 50 MG/ML IJ SOLN
INTRAMUSCULAR | Status: DC | PRN
  Administered 2021-08-05: 15 mg via INTRAVENOUS
  Administered 2021-08-05: 10 mg via INTRAVENOUS

## 2021-08-05 MED ORDER — CEFAZOLIN SODIUM-DEXTROSE 2-4 GM/100ML-% IV SOLN
INTRAVENOUS | Status: AC
  Filled 2021-08-05: qty 100

## 2021-08-05 MED ORDER — IBUPROFEN 800 MG PO TABS: 800.0000 mg | ORAL_TABLET | Freq: Three times a day (TID) | ORAL | 0 refills | Status: AC

## 2021-08-05 MED ORDER — ROCURONIUM BROMIDE 100 MG/10ML IV SOLN
INTRAVENOUS | Status: DC | PRN
  Administered 2021-08-05: 60 mg via INTRAVENOUS

## 2021-08-05 MED ORDER — KETOROLAC TROMETHAMINE 30 MG/ML IJ SOLN
INTRAMUSCULAR | Status: AC
  Filled 2021-08-05: qty 1

## 2021-08-05 MED ORDER — EPINEPHRINE PF 1 MG/ML IJ SOLN
INTRAMUSCULAR | Status: AC
  Filled 2021-08-05: qty 4

## 2021-08-05 MED ORDER — BUPIVACAINE HCL (PF) 0.5 % IJ SOLN
INTRAMUSCULAR | Status: DC | PRN
  Administered 2021-08-05: 10 mL

## 2021-08-05 MED ORDER — BUPIVACAINE LIPOSOME 1.3 % IJ SUSP
INTRAMUSCULAR | Status: DC | PRN
  Administered 2021-08-05: 20 mL

## 2021-08-05 MED ORDER — DEXAMETHASONE SODIUM PHOSPHATE 10 MG/ML IJ SOLN
INTRAMUSCULAR | Status: DC | PRN
  Administered 2021-08-05: 10 mg via INTRAVENOUS

## 2021-08-05 MED ORDER — METHYLPREDNISOLONE ACETATE 40 MG/ML IJ SUSP
INTRAMUSCULAR | Status: DC | PRN
  Administered 2021-08-05: 10 mL via INTRAMUSCULAR

## 2021-08-05 MED ORDER — ACETAMINOPHEN 10 MG/ML IV SOLN: 1000.0000 mg | Freq: Once | INTRAVENOUS | Status: DC | PRN

## 2021-08-05 MED ORDER — FENTANYL CITRATE (PF) 100 MCG/2ML IJ SOLN
INTRAMUSCULAR | Status: DC | PRN
  Administered 2021-08-05 (×2): 50 ug via INTRAVENOUS

## 2021-08-05 MED ORDER — FENTANYL CITRATE PF 50 MCG/ML IJ SOSY: 50.0000 ug | PREFILLED_SYRINGE | Freq: Once | INTRAMUSCULAR | Status: AC

## 2021-08-05 MED ORDER — ONDANSETRON HCL 4 MG/2ML IJ SOLN
INTRAMUSCULAR | Status: AC
  Filled 2021-08-05: qty 2

## 2021-08-05 MED ORDER — CHLORHEXIDINE GLUCONATE 0.12 % MT SOLN
OROMUCOSAL | Status: AC
  Administered 2021-08-05: 15 mL via OROMUCOSAL
  Filled 2021-08-05: qty 15

## 2021-08-05 MED ORDER — OXYCODONE HCL 5 MG PO TABS: 5.0000 mg | ORAL_TABLET | ORAL | 0 refills | Status: AC | PRN

## 2021-08-05 MED ORDER — SUGAMMADEX SODIUM 200 MG/2ML IV SOLN
INTRAVENOUS | Status: DC | PRN
  Administered 2021-08-05: 150 mg via INTRAVENOUS

## 2021-08-05 MED ORDER — BUPIVACAINE HCL (PF) 0.5 % IJ SOLN
INTRAMUSCULAR | Status: AC
  Filled 2021-08-05: qty 10

## 2021-08-05 MED ORDER — KETOROLAC TROMETHAMINE 30 MG/ML IJ SOLN
INTRAMUSCULAR | Status: DC | PRN
  Administered 2021-08-05: 30 mg via INTRAVENOUS

## 2021-08-05 MED ORDER — ONDANSETRON HCL 4 MG/2ML IJ SOLN
INTRAMUSCULAR | Status: DC | PRN
  Administered 2021-08-05: 4 mg via INTRAVENOUS

## 2021-08-05 MED ORDER — FENTANYL CITRATE (PF) 100 MCG/2ML IJ SOLN
INTRAMUSCULAR | Status: AC
  Filled 2021-08-05: qty 2

## 2021-08-05 MED ORDER — MIDAZOLAM HCL 2 MG/2ML IJ SOLN: 1.0000 mg | INTRAMUSCULAR | Status: DC | PRN

## 2021-08-05 MED ORDER — ACETAMINOPHEN 500 MG PO TABS: 1000.0000 mg | ORAL_TABLET | Freq: Three times a day (TID) | ORAL | 2 refills | Status: AC

## 2021-08-05 MED ORDER — ONDANSETRON 4 MG PO TBDP: 4.0000 mg | ORAL_TABLET | Freq: Three times a day (TID) | ORAL | 0 refills | Status: AC | PRN

## 2021-08-05 MED ORDER — MIDAZOLAM HCL 2 MG/2ML IJ SOLN
INTRAMUSCULAR | Status: AC
  Administered 2021-08-05: 2 mg via INTRAVENOUS
  Filled 2021-08-05: qty 2

## 2021-08-05 MED ORDER — RINGERS IRRIGATION IR SOLN
Status: DC | PRN
  Administered 2021-08-05: 12000 mL

## 2021-08-05 MED ORDER — LIDOCAINE HCL (CARDIAC) PF 100 MG/5ML IV SOSY
PREFILLED_SYRINGE | INTRAVENOUS | Status: DC | PRN
  Administered 2021-08-05: 80 mg via INTRAVENOUS

## 2021-08-05 MED ORDER — ONDANSETRON HCL 4 MG/2ML IJ SOLN
INTRAMUSCULAR | Status: DC
Start: 2021-08-05 — End: 2021-08-05
  Filled 2021-08-05: qty 2

## 2021-08-05 MED ORDER — DEXAMETHASONE SODIUM PHOSPHATE 10 MG/ML IJ SOLN
INTRAMUSCULAR | Status: AC
  Filled 2021-08-05: qty 1

## 2021-08-05 MED ORDER — LACTATED RINGERS IV SOLN: INTRAVENOUS | Status: DC

## 2021-08-05 MED ORDER — CHLORHEXIDINE GLUCONATE 0.12 % MT SOLN
15.0000 mL | Freq: Once | OROMUCOSAL | Status: AC
Start: 2021-08-05 — End: 2021-08-05

## 2021-08-05 MED ORDER — FENTANYL CITRATE PF 50 MCG/ML IJ SOSY
PREFILLED_SYRINGE | INTRAMUSCULAR | Status: AC
  Administered 2021-08-05: 50 ug via INTRAVENOUS
  Filled 2021-08-05: qty 1

## 2021-08-05 MED ORDER — BUPIVACAINE LIPOSOME 1.3 % IJ SUSP
INTRAMUSCULAR | Status: AC
  Filled 2021-08-05: qty 20

## 2021-08-05 MED ORDER — OXYCODONE HCL 5 MG PO TABS: 5.0000 mg | ORAL_TABLET | Freq: Once | ORAL | Status: DC | PRN

## 2021-08-05 MED ORDER — BUPIVACAINE HCL (PF) 0.5 % IJ SOLN
INTRAMUSCULAR | Status: AC
Start: 2021-08-05 — End: ?
  Filled 2021-08-05: qty 30

## 2021-08-05 MED ORDER — TRIAMCINOLONE ACETONIDE 40 MG/ML IJ SUSP
INTRAMUSCULAR | Status: AC
  Filled 2021-08-05: qty 1

## 2021-08-05 MED ORDER — FENTANYL CITRATE (PF) 100 MCG/2ML IJ SOLN: 25.0000 ug | INTRAMUSCULAR | Status: DC | PRN

## 2021-08-05 MED ORDER — MIDAZOLAM HCL 2 MG/2ML IJ SOLN: 2.0000 mg | INTRAMUSCULAR | Status: DC | PRN

## 2021-08-05 MED ORDER — ONDANSETRON HCL 4 MG/2ML IJ SOLN: 4.0000 mg | Freq: Once | INTRAMUSCULAR | Status: DC | PRN

## 2021-08-05 MED ORDER — LACTATED RINGERS IV SOLN
INTRAVENOUS | Status: DC | PRN
  Administered 2021-08-05: 4 mL

## 2021-08-05 MED ORDER — PROPOFOL 10 MG/ML IV BOLUS
INTRAVENOUS | Status: DC | PRN
  Administered 2021-08-05: 130 mg via INTRAVENOUS

## 2021-08-05 MED ORDER — ORAL CARE MOUTH RINSE: 15.0000 mL | Freq: Once | OROMUCOSAL | Status: AC

## 2021-08-05 MED ORDER — SODIUM CHLORIDE 0.9 % IV SOLN
INTRAVENOUS | Status: DC
Start: 2021-08-05 — End: 2021-08-05

## 2021-08-05 MED ORDER — CEFAZOLIN SODIUM-DEXTROSE 2-4 GM/100ML-% IV SOLN
2.0000 g | INTRAVENOUS | Status: AC
  Administered 2021-08-05: 2 g via INTRAVENOUS

## 2021-08-05 MED ORDER — LIDOCAINE HCL (PF) 1 % IJ SOLN
INTRAMUSCULAR | Status: AC
  Filled 2021-08-05: qty 30

## 2021-08-05 MED ORDER — ASPIRIN 325 MG PO TBEC: 325.0000 mg | DELAYED_RELEASE_TABLET | Freq: Every day | ORAL | 0 refills | Status: AC

## 2021-08-05 SURGICAL SUPPLY — 61 items
ADAPTER IRRIG TUBE 2 SPIKE SOL (ADAPTER) ×6 IMPLANT
ADPR TBG 2 SPK PMP STRL ASCP (ADAPTER) ×4
ANCH SUT 2.9 PUSHLOCK ANCH (Orthopedic Implant) ×2 IMPLANT
ANCH SUT SHRT 12.5 CANN EYLT (Anchor) ×2 IMPLANT
ANCHOR SUT BIOCOMP LK 2.9X12.5 (Anchor) ×2 IMPLANT
APL PRP STRL LF DISP 70% ISPRP (MISCELLANEOUS) ×4
BLADE SHAVER 4.5X7 STR FR (MISCELLANEOUS) ×3 IMPLANT
BUR BR 5.5 WIDE MOUTH (BURR) IMPLANT
CANNULA PART THRD DISP 5.75X7 (CANNULA) ×8 IMPLANT
CANNULA PARTIAL THREAD 2X7 (CANNULA) ×1 IMPLANT
CANNULA TWIST IN 8.25X9CM (CANNULA) IMPLANT
CHLORAPREP W/TINT 26 (MISCELLANEOUS) ×5 IMPLANT
COOLER POLAR GLACIER W/PUMP (MISCELLANEOUS) ×3 IMPLANT
DRAPE 3/4 80X56 (DRAPES) ×3 IMPLANT
DRAPE INCISE IOBAN 66X45 STRL (DRAPES) ×3 IMPLANT
DRAPE U-SHAPE 47X51 STRL (DRAPES) ×3 IMPLANT
ELECT REM PT RETURN 9FT ADLT (ELECTROSURGICAL) ×3
ELECTRODE REM PT RTRN 9FT ADLT (ELECTROSURGICAL) ×1 IMPLANT
GAUZE 4X4 16PLY ~~LOC~~+RFID DBL (SPONGE) ×1 IMPLANT
GAUZE SPONGE 4X4 12PLY STRL (GAUZE/BANDAGES/DRESSINGS) ×3 IMPLANT
GAUZE XEROFORM 1X8 LF (GAUZE/BANDAGES/DRESSINGS) ×3 IMPLANT
GLOVE BIOGEL PI IND STRL 8 (GLOVE) ×2 IMPLANT
GLOVE BIOGEL PI INDICATOR 8 (GLOVE) ×1
GLOVE SURG ORTHO 8.0 STRL STRW (GLOVE) ×6 IMPLANT
GLOVE SURG SYN 8.0 (GLOVE) ×3 IMPLANT
GLOVE SURG SYN 8.0 PF PI (GLOVE) ×1 IMPLANT
GOWN STRL REUS W/ TWL LRG LVL3 (GOWN DISPOSABLE) ×2 IMPLANT
GOWN STRL REUS W/ TWL XL LVL3 (GOWN DISPOSABLE) ×2 IMPLANT
GOWN STRL REUS W/TWL LRG LVL3 (GOWN DISPOSABLE) ×3
GOWN STRL REUS W/TWL XL LVL3 (GOWN DISPOSABLE) ×3
IV LACTATED RINGER IRRG 3000ML (IV SOLUTION) ×39
IV LR IRRIG 3000ML ARTHROMATIC (IV SOLUTION) ×33 IMPLANT
KIT STABILIZATION SHOULDER (MISCELLANEOUS) ×3 IMPLANT
KIT SUTURETAK 3.0 INSERT PERC (KITS) IMPLANT
KIT TURNOVER KIT A (KITS) ×3 IMPLANT
MANIFOLD NEPTUNE II (INSTRUMENTS) ×6 IMPLANT
MASK FACE SPIDER DISP (MASK) ×3 IMPLANT
MAT ABSORB  FLUID 56X50 GRAY (MISCELLANEOUS) ×6
MAT ABSORB FLUID 56X50 GRAY (MISCELLANEOUS) ×4 IMPLANT
PACK ARTHROSCOPY SHOULDER (MISCELLANEOUS) ×3 IMPLANT
PAD ABD DERMACEA PRESS 5X9 (GAUZE/BANDAGES/DRESSINGS) ×5 IMPLANT
PAD WRAPON POLAR SHDR XLG (MISCELLANEOUS) ×2 IMPLANT
PASSER SUT FIRSTPASS SELF (INSTRUMENTS) ×3 IMPLANT
SLING ARM M TX990204 (SOFTGOODS) ×2 IMPLANT
SLING ULTRA II M (MISCELLANEOUS) IMPLANT
SPONGE T-LAP 18X18 ~~LOC~~+RFID (SPONGE) ×3 IMPLANT
SUT ETHILON 4-0 (SUTURE) ×3
SUT ETHILON 4-0 FS2 18XMFL BLK (SUTURE) ×2
SUT LASSO 90 DEG SD STR (SUTURE) IMPLANT
SUT MNCRL 4-0 (SUTURE)
SUT MNCRL 4-0 27XMFL (SUTURE)
SUTURE ETHLN 4-0 FS2 18XMF BLK (SUTURE) ×2 IMPLANT
SUTURE MNCRL 4-0 27XMF (SUTURE) IMPLANT
SYSTEM IMPL TENODESIS LNT 2.9 (Orthopedic Implant) ×2 IMPLANT
TAPE MICROFOAM 4IN (TAPE) ×3 IMPLANT
TUBING CONNECTING 10 (TUBING) ×3 IMPLANT
TUBING INFLOW SET DBFLO PUMP (TUBING) ×3 IMPLANT
TUBING OUTFLOW SET DBLFO PUMP (TUBING) ×3 IMPLANT
WAND WEREWOLF FLOW 90D (MISCELLANEOUS) IMPLANT
WATER STERILE IRR 500ML POUR (IV SOLUTION) ×3 IMPLANT
WRAPON POLAR PAD SHDR XLG (MISCELLANEOUS) ×3

## 2021-08-05 NOTE — Transfer of Care (Signed)
Immediate Anesthesia Transfer of Care Note  Patient: Susan Stokes  Procedure(s) Performed: Left shoulder arthroscopic lysis of adhesions capsular release and manipulation under anesthesia with corticosteroid injection, biceps tenodesis (Left: Shoulder)  Patient Location: PACU  Anesthesia Type:General and Regional  Level of Consciousness: awake, alert  and oriented  Airway & Oxygen Therapy: Patient Spontanous Breathing and Patient connected to nasal cannula oxygen  Post-op Assessment: Report given to RN, Post -op Vital signs reviewed and stable and Patient moving all extremities  Post vital signs: Reviewed and stable  Last Vitals:  Vitals Value Taken Time  BP 109/63 08/05/21 1700  Temp    Pulse 95 08/05/21 1703  Resp 17 08/05/21 1703  SpO2 97 % 08/05/21 1703  Vitals shown include unvalidated device data.  Last Pain:  Vitals:   08/05/21 1258  TempSrc: Temporal  PainSc: 0-No pain         Complications: No notable events documented.

## 2021-08-05 NOTE — Anesthesia Preprocedure Evaluation (Addendum)
Anesthesia Evaluation  Patient identified by MRN, date of birth, ID band Patient awake    Reviewed: Allergy & Precautions, NPO status , Patient's Chart, lab work & pertinent test results  History of Anesthesia Complications Negative for: history of anesthetic complications  Airway Mallampati: I   Neck ROM: Full    Dental no notable dental hx.    Pulmonary neg pulmonary ROS,    Pulmonary exam normal breath sounds clear to auscultation       Cardiovascular Exercise Tolerance: Good negative cardio ROS Normal cardiovascular exam Rhythm:Regular Rate:Normal     Neuro/Psych  Headaches, PSYCHIATRIC DISORDERS Depression    GI/Hepatic GERD  ,S/p gastric sleeve   Endo/Other  diabetes, Type 2Obesity   Renal/GU negative Renal ROS     Musculoskeletal   Abdominal   Peds  Hematology negative hematology ROS (+)   Anesthesia Other Findings   Reproductive/Obstetrics                            Anesthesia Physical Anesthesia Plan  ASA: 2  Anesthesia Plan: General and Regional   Post-op Pain Management: Regional block*   Induction: Intravenous  PONV Risk Score and Plan: 3 and Ondansetron, Dexamethasone and Treatment may vary due to age or medical condition  Airway Management Planned: Oral ETT  Additional Equipment:   Intra-op Plan:   Post-operative Plan: Extubation in OR  Informed Consent: I have reviewed the patients History and Physical, chart, labs and discussed the procedure including the risks, benefits and alternatives for the proposed anesthesia with the patient or authorized representative who has indicated his/her understanding and acceptance.     Dental advisory given  Plan Discussed with: CRNA  Anesthesia Plan Comments: (Plan for preoperative interscalene nerve block and GETA.  Patient consented for risks of anesthesia including but not limited to:  - adverse reactions to  medications - damage to eyes, teeth, lips or other oral mucosa - nerve damage due to positioning  - sore throat or hoarseness - damage to heart, brain, nerves, lungs, other parts of body or loss of life  Informed patient about role of CRNA in peri- and intra-operative care.  Patient voiced understanding.)       Anesthesia Quick Evaluation

## 2021-08-05 NOTE — Op Note (Signed)
OPERATIVE NOTE SURGERY DATE: 08/05/2021  PRE-OP DIAGNOSIS: 1. Left shoulder adhesive capsulitis and synovitis 2. Left subacromial bursitis   POST-OP DIAGNOSIS:  1. Left shoulder adhesive capsulitis and synovitis 2. Left subacromial bursitis 3. Left shoulder type II SLAP tear with significant biceps tendinopathy  PROCEDURES: 1. Left shoulder capsular releases with synovectomy, lysis of adhesions, manipulation under anesthesia  2. Left subacromial decompression without acromioplasty  3. Left shoulder arthroscopic biceps tenodesis 4. Left glenohumeral and subacromial injections with corticosteroid  SURGEON:  Cato Mulligan, MD  ASSISTANT: Shonna Chock, PA-S   ANESTHESIA: Regional block with Exparel, Gen  TOTAL IV FLUIDS: per anesthesia record   ESTIMATED BLOOD LOSS: Minimal  DRAINS:  None.  SPECIMENS: None  IMPLANTS:  - Arthrex PushLock anchor x 2  COMPLICATIONS: none  INDICATIONS: Susan Stokes is a 51 y.o. female with complaints of left shoulder pain and stiffness. Preoperative left shoulder examination was notable for severe motion loss and pain. The patient has failed multiple rounds of corticosteroid injections and extensive physical therapy exercises with this being a recurrence of prior left adhesive capsulitis.  Given this, patient wished to proceed with surgical intervention in the form of capsular releases, manipulation under anesthesia, and subacromial decompression/bursectomy with corticosteroid injections into glenohumeral joint and subacromial space. After discussion of risks, benefits, and alternatives to surgery, the patient elected to proceed.    OPERATIVE FINDINGS:  Operative Shoulder Range of Motion:  Preop  Postop  Flexion  100 150  Abduction  70 120  ER at 0  30 85  ER at 90  50 120  IR at 90  20 50  IR posterior  T12 T6    Intra-operative findings: A thorough arthroscopic examination of the shoulder was performed.  The findings are: 1.  Biceps tendon:  Significant tendinopathy of the proximal insertion and intraarticular portion; instability of biceps at biceps anchor complex 2. Superior labrum: injected with surrounding synovitis with type II SLAP tear 3. Posterior labrum and capsule: Normal labrum, significant synovitis and thickening of posterior capsule 4. Inferior capsule and inferior recess: Significant synovitis and thickening of capsule 5. Glenoid cartilage surface: Grade 1 changes  6. Supraspinatus attachment: Normal 7. Posterior rotator cuff attachment:  Normal 8. Humeral head articular cartilage: Grade 1 changes. 9. Rotator interval: significant synovitis and thickening of capsule 10: Subscapularis tendon:  Normal 11. Anterior labrum: Normal 12. IGHL: significant synovitis around IGHL   DETAILS OF PROCEDURE: The patient was identified in the preoperative holding area. Informed consent was obtained. Operative extremity was marked. After satisfactory upper extremity regional block with Exparil was performed in the preoperative holding area, the patient was brought to the operating room and placed in a well-padded beach chair positioner.  Eyes were protected, head was affixed in neutral, and the patient was given preoperative IV antibiotics within 30 minutes of the start of the case and a surgical time-out occurred. The upper extremity was prescrubbed with Hibiclens and alcohol, prepped with ChloraPrep and draped in the usual sterile fashion.    I then created a standard posterior portal with an 11 blade. The glenohumeral joint was easily entered with a blunt trochar and the arthroscope introduced. A standard anterior portal was made.The findings of diagnostic arthroscopy are described above.  Frayed cartilaginous surfaces of the glenoid and humeral head were gently debrided with an oscillating shaver.   I first performed an arthroscopic biceps tenodesis given that taking tension off of the biceps would address the biceps  instability, SLAP tear,  and tendinopathy while allowing for immediate range of motion. The Loop n Tack technique was used to pass a FiberTape through the biceps in a locked fashion adjacent to the biceps anchor.  A hole for a 2.9 mm Arthrex PushLock was drilled in the bicipital groove just superior to the subscapularis tendon insertion.  The biceps tendon was then cut and the biceps anchor complex was debrided down to a stable base on the superior labrum.  The FiberTape was loaded onto the PushLock anchor and impacted into place into the previously drilled hole in the bicipital groove. This however, did not achieve appropriate fixation as a loop of suture attached to the biceps was still present. A stitch was passed through this loop and pulling tension through the loop allowed for appropriate tensioning. Therefore, an accessory anterolateral portal was made and stitch was retrieved through this portal. A hole for another PushLock anchor was placed at the articular margin of the supraspinatus and PushLock was impacted into place. This appropriately secured the biceps into the bicipital groove and took it off of tension.    The joint was remarkable for moderate synovitis which was chronic in the anterior, superior, posterior, and inferior aspects. This required synovectomy with a shaver and Arthrocare device in the affected compartments listed above.  A combination of electrocautery and oscillating shaver was used to debride the rotator interval tissue.  The posterior aspect of the coracoid was exposed.  The anterior and posterior aspects of the subscapularis were cleared of tissue so there was no tethering.  Next, an upbiting duckbill basket was then used to perform a capsulotomy of the rotator interval and then the MGHL and the IGHL (anterior band).  Care was taken to protect the intraarticular subscapularis.  Adhesions were cleared off the subscapularis to allow full internal and external rotation.    The  arthroscope was placed into the anterior portal.  The posterior capsule was quite thickened and inflamed as well.  After synovectomy, the duckbill basket was used to perform release from the superior glenoid, down into the axillary pouch, around to the anterior band of the IGHL.  A complete 360 capsulotomy was performed in this manner.  Care was taken to protect the axillary nerve by staying on the glenoid side and making sure not to rotate the shoulder externally during the capsulotomy.  Hemostasis was achieved with the ArthroCare wand.  There was no unusual bleeding.    The arthroscope was then placed in the subacromial space. An accessory lateral portal was established. 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  A complete subacromial bursectomy and debridement of the gutters was carried out with a shaver.  ArthroCare was used to control bleeding. The instruments were removed from the joint.    Manipulation under anesthesia was then carried out in a gentle, controlled manner with short lever arms.  There was a gentle release of adhesions. See above chart for post-manipulation improvement in range of motion.   The skin was closed with interrupted 3-0 nylon sutures. Injections of 40 mg Kenalog with 1% lidocaine and 0.5% ropivacaine were placed separately in the glenohumeral joint and subacromial space with a spinal needle.  Xeroform gauze, sterile dressings were applied. The patient was placed in a shoulder sling.  Polar Care was applied.    Instrument, sponge, and needle counts were correct prior to closure and at the conclusion of the case.   DISPOSITION: PACU - hemodynamically stable.  POSTOPERATIVE PLAN:  The patient will be discharged home. PT to begin 1-2 days postop for range of motion exercises.  ASA x 2 weeks for DVT ppx. Sling only for comfort and wean this week as soon as tolerated. RTC in ~1-2 weeks.

## 2021-08-05 NOTE — Anesthesia Procedure Notes (Signed)
Procedure Name: Intubation Date/Time: 08/05/2021 2:16 PM  Performed by: Esaw Grandchild, CRNAPre-anesthesia Checklist: Patient identified, Emergency Drugs available, Suction available and Patient being monitored Patient Re-evaluated:Patient Re-evaluated prior to induction Oxygen Delivery Method: Circle system utilized Preoxygenation: Pre-oxygenation with 100% oxygen Induction Type: IV induction Ventilation: Mask ventilation without difficulty Laryngoscope Size: Miller and 2 Grade View: Grade I Tube type: Oral Tube size: 7.0 mm Number of attempts: 1 Airway Equipment and Method: Stylet, Oral airway, Bite block and LTA kit utilized Placement Confirmation: ETT inserted through vocal cords under direct vision, positive ETCO2 and breath sounds checked- equal and bilateral Secured at: 18 cm Tube secured with: Tape Dental Injury: Teeth and Oropharynx as per pre-operative assessment

## 2021-08-05 NOTE — H&P (Signed)
Paper H&P to be scanned into permanent record. H&P reviewed. No significant changes noted.  

## 2021-08-05 NOTE — Discharge Instructions (Addendum)
Post-Op Instructions - Shoulder Capsular Release/Manipulation Under Anesthesia  1. Bracing: You should wear a sling for comfort only. Sling should NOT be worn longer than ~1 week.   2. Driving: No driving for 1 week post-op. Must be off narcotic pain medication.  3. Activity: No active lifting for ~2 weeks. Perform range of motion exercises DAILY at home and with physical therapy as prescribed.   4. Physical Therapy: This NEEDS to begin 1 or 2 days after surgery, and proceed ~6-12 weeks. This should be at least 3x/week the first few weeks until range of motion is normalized.   5. Medications:  - You will be provided a prescription for narcotic pain medicine. After surgery, take 1-2 narcotic tablets every 4 hours if needed for severe pain.  - A prescription for anti-nausea medication will be provided in case the narcotic medicine causes nausea - take 1 tablet every 6 hours only if nauseated.   - Take tylenol 1000 mg (2 Extra Strength tablets or 3 regular strength) every 8 hours for pain.  May decrease or stop tylenol 5 days after surgery if you are having minimal pain. - Take ibuprofen 800mg  three times/day with food for at least two weeks every day. This will help reduce post-operative inflammation and swelling. Please call our offices if this causes any stomach/GI irritation.  - Take Aspirin 325mg /daily x 2 weeks to help prevent DVT/PE (Blood clots)   If you are taking prescription medication for anxiety, depression, insomnia, muscle spasm, chronic pain, or for attention deficit disorder, you are advised that you are at a higher risk of adverse effects with use of narcotics post-op, including narcotic addiction/dependence, depressed breathing, death. If you use non-prescribed substances: alcohol, marijuana, cocaine, heroin, methamphetamines, etc., you are at a higher risk of adverse effects with use of narcotics post-op, including narcotic addiction/dependence, depressed breathing, death. You  are advised that taking > 50 morphine milligram equivalents (MME) of narcotic pain medication per day results in twice the risk of overdose or death. For your prescription provided: oxycodone 5 mg - taking more than 6 tablets per day would result in > 50 morphine milligram equivalents (MME) of narcotic pain medication. Be advised that we will prescribe narcotics short-term, for acute post-operative pain only - 3 weeks for major operations such as shoulder repair/reconstruction surgeries.    6. Post-Op Appointment:  Your first post-op appointment will be ~2 week post-op.  7. Work or School: For most, but not all procedures, we advise staying out of work or school for at least 1 to 2 weeks in order to recover from the stress of surgery and to allow time for healing.   If you need a work or school note this can be provided.            AMBULATORY SURGERY  DISCHARGE INSTRUCTIONS   The drugs that you were given will stay in your system until tomorrow so for the next 24 hours you should not:  Drive an automobile Make any legal decisions Drink any alcoholic beverage   You may resume regular meals tomorrow.  Today it is better to start with liquids and gradually work up to solid foods.  You may eat anything you prefer, but it is better to start with liquids, then soup and crackers, and gradually work up to solid foods.   Please notify your doctor immediately if you have any unusual bleeding, trouble breathing, redness and pain at the surgery site, drainage, fever, or pain not relieved by medication.  Information for Discharge Teaching:  DO NOT REMOVE TEAL EXPAREL BAND FOR 4 DAYS (96 hours); on August 09, 2021  EXPAREL (bupivacaine liposome injectable suspension)   Your surgeon or anesthesiologist gave you EXPAREL(bupivacaine) to help control your pain after surgery.  EXPAREL is a local anesthetic that provides pain relief by numbing the tissue around the surgical site. EXPAREL is  designed to release pain medication over time and can control pain for up to 72 hours. Depending on how you respond to EXPAREL, you may require less pain medication during your recovery.  Possible side effects: Temporary loss of sensation or ability to move in the area where bupivacaine was injected. Nausea, vomiting, constipation Rarely, numbness and tingling in your mouth or lips, lightheadedness, or anxiety may occur. Call your doctor right away if you think you may be experiencing any of these sensations, or if you have other questions regarding possible side effects.  Follow all other discharge instructions given to you by your surgeon or nurse. Eat a healthy diet and drink plenty of water or other fluids.  If you return to the hospital for any reason within 96 hours following the administration of EXPAREL, it is important for health care providers to know that you have received this anesthetic. A teal colored band has been placed on your arm with the date, time and amount of EXPAREL you have received in order to alert and inform your health care providers. Please leave this armband in place for the full 96 hours following administration, and then you may remove the band.         Please contact your physician with any problems or Same Day Surgery at [phone], Monday through Friday 6 am to 4 pm, or [place] at [facility] number at [phone].

## 2021-08-05 NOTE — Anesthesia Procedure Notes (Signed)
Anesthesia Regional Block: Interscalene brachial plexus block   Pre-Anesthetic Checklist: , timeout performed,  Correct Patient, Correct Site, Correct Laterality,  Correct Procedure,, site marked,  Risks and benefits discussed,  Surgical consent,  Pre-op evaluation,  At surgeon's request and post-op pain management  Laterality: Left  Prep: chloraprep       Needles:  Injection technique: Single-shot  Needle Type: Stimiplex          Additional Needles:   Procedures:,,,, ultrasound used (permanent image in chart),,   Motor weakness within 20 minutes.  Narrative:  Start time: 08/05/2021 1:52 PM End time: 08/05/2021 1:54 PM Injection made incrementally with aspirations every 5 mL.  Performed by: Personally  Anesthesiologist: Reed Breech, MD  Additional Notes: Functioning IV was confirmed and monitors applied.  Sterile prep and drape, hand hygiene and sterile gloves were used. Ultrasound guidance: relevant anatomy identified, needle position confirmed, local anesthetic spread visualized around nerve(s), vascular puncture avoided.  Image saved to electronic medical record.  Negative aspiration prior to incremental administration of local anesthetic for total 20 ml Exparel and 10 ml bupivacaine 0.5% given in interscalene distribution. The patient tolerated the procedure well. Vital signs and moderate sedation medications recorded in RN notes.

## 2021-08-06 NOTE — Anesthesia Postprocedure Evaluation (Signed)
Anesthesia Post Note  Patient: Susan Stokes  Procedure(s) Performed: Left shoulder arthroscopic lysis of adhesions capsular release and manipulation under anesthesia with corticosteroid injection, biceps tenodesis (Left: Shoulder)  Patient location during evaluation: PACU Anesthesia Type: Regional Level of consciousness: awake and alert Pain management: pain level controlled Vital Signs Assessment: post-procedure vital signs reviewed and stable Respiratory status: spontaneous breathing, nonlabored ventilation, respiratory function stable and patient connected to nasal cannula oxygen Cardiovascular status: blood pressure returned to baseline and stable Postop Assessment: no apparent nausea or vomiting Anesthetic complications: no   No notable events documented.   Last Vitals:  Vitals:   08/05/21 1725 08/05/21 1738  BP: (!) 105/59 110/73  Pulse: (!) 102 95  Resp: 12 15  Temp: (!) 36.1 C 36.7 C  SpO2: 98% 100%    Last Pain:  Vitals:   08/05/21 1738  TempSrc: Temporal  PainSc: 0-No pain                 Lenard Simmer

## 2021-08-07 ENCOUNTER — Encounter: Payer: Self-pay | Admitting: Orthopedic Surgery

## 2021-08-12 ENCOUNTER — Encounter: Payer: Self-pay | Admitting: Orthopedic Surgery
